# Patient Record
Sex: Male | Born: 1995 | Race: White | Hispanic: No | Marital: Married | Smoking: Never smoker
Health system: Southern US, Community
[De-identification: ages and names within clinical notes are randomized; demographics above are authoritative.]

## PROBLEM LIST (undated history)

## (undated) DIAGNOSIS — F419 Anxiety disorder, unspecified: Secondary | ICD-10-CM

## (undated) DIAGNOSIS — F329 Major depressive disorder, single episode, unspecified: Secondary | ICD-10-CM

## (undated) DIAGNOSIS — F909 Attention-deficit hyperactivity disorder, unspecified type: Secondary | ICD-10-CM

## (undated) DIAGNOSIS — F32A Depression, unspecified: Secondary | ICD-10-CM

## (undated) DIAGNOSIS — G4730 Sleep apnea, unspecified: Secondary | ICD-10-CM

## (undated) HISTORY — DX: Major depressive disorder, single episode, unspecified: F32.9

## (undated) HISTORY — DX: Anxiety disorder, unspecified: F41.9

## (undated) HISTORY — DX: Attention-deficit hyperactivity disorder, unspecified type: F90.9

## (undated) HISTORY — DX: Depression, unspecified: F32.A

## (undated) HISTORY — DX: Sleep apnea, unspecified: G47.30

## (undated) HISTORY — PX: WISDOM TOOTH EXTRACTION: SHX21

---
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
## 2016-04-02 ENCOUNTER — Ambulatory Visit (INDEPENDENT_AMBULATORY_CARE_PROVIDER_SITE_OTHER): Payer: 59 | Admitting: Physician Assistant

## 2016-04-02 VITALS — BP 109/70 | HR 100 | Temp 98.1°F | Resp 16 | Ht 71.18 in | Wt 138.6 lb

## 2016-04-02 DIAGNOSIS — B349 Viral infection, unspecified: Secondary | ICD-10-CM | POA: Diagnosis not present

## 2016-04-02 DIAGNOSIS — R0981 Nasal congestion: Secondary | ICD-10-CM | POA: Diagnosis not present

## 2016-04-02 DIAGNOSIS — R531 Weakness: Secondary | ICD-10-CM

## 2016-04-02 DIAGNOSIS — R05 Cough: Secondary | ICD-10-CM

## 2016-04-02 DIAGNOSIS — R059 Cough, unspecified: Secondary | ICD-10-CM

## 2016-04-02 MED ORDER — HYDROCODONE-HOMATROPINE 5-1.5 MG/5ML PO SYRP: 5.0000 mL | ORAL_SOLUTION | Freq: Three times a day (TID) | ORAL | 0 refills | Status: DC | PRN

## 2016-04-02 MED ORDER — BENZONATATE 100 MG PO CAPS: 100.0000 mg | ORAL_CAPSULE | Freq: Three times a day (TID) | ORAL | 0 refills | Status: DC | PRN

## 2016-04-02 MED ORDER — OSELTAMIVIR PHOSPHATE 75 MG PO CAPS: 75.0000 mg | ORAL_CAPSULE | Freq: Two times a day (BID) | ORAL | 0 refills | Status: DC

## 2016-04-02 NOTE — Patient Instructions (Addendum)
Please start Tamiflu **tonight** Flonase: 2 sprays each nostril at night and in the morning.  Neti-pot for nasal rinses.  Sore throat: Warm tea with honey, warm salt water gargles.  Please get plenty of rest and drink lots of fluids.  Come back if you are not better in 5-7 days.   Thank you for coming in today. I hope you feel we met your needs.  Feel free to call [facility] if you have any questions or further requests.  Please consider signing up for MyChart if you do not already have it, as this is a great way to communicate with me.  Best,  Whitney McVey, PA-C  IF you received an x-ray today, you will receive an invoice from [facility]. Please contact [facility] at [phone] with questions or concerns regarding your invoice.   IF you received labwork today, you will receive an invoice from [place]. Please contact [facility] at [phone] with questions or concerns regarding your invoice.   Our billing staff will not be able to assist you with questions regarding bills from these companies.  You will be contacted with the lab results as soon as they are available. The fastest way to get your results is to activate your My Chart account. Instructions are located on the last page of this paperwork. If you have not heard from [place] regarding the results in 2 weeks, please contact this office.

## 2016-04-02 NOTE — Progress Notes (Signed)
Micheal Lewis  MRN: [id number] DOB: 1995-11-25  PCP: Binnie Rail, MD  Subjective:  Pt is a 21 year old male who presents to clinic for cough, body aches, weakness x two days.  +body aches, headache, dizziness, weakness, cough, sneezing. Cough keeps him up at night. Cough present all day. Has not tried anything to feel better.  Sick contacts at work. No flu shot this year.  Denies nausea, vomiting, diarrhea, abdominal pain, chest pain, palpitations, syncope.   Review of Systems  Constitutional: Positive for chills and fatigue. Negative for diaphoresis and fever.  HENT: Positive for congestion, postnasal drip, rhinorrhea and sneezing. Negative for sinus pain and sinus pressure.   Respiratory: Positive for cough. Negative for chest tightness, shortness of breath and wheezing.   Cardiovascular: Negative for chest pain, palpitations and leg swelling.  Gastrointestinal: Negative for diarrhea, nausea and vomiting.  Musculoskeletal: Negative for neck pain.  Neurological: Negative for dizziness, syncope, light-headedness and headaches.  Psychiatric/Behavioral: Negative for sleep disturbance. The patient is not nervous/anxious.     Patient Active Problem List   Diagnosis Date Noted  . Depression 07/30/2015  . Anal itching 06/30/2015  . ADD (attention deficit disorder) 01/16/2015    Current Outpatient Prescriptions on File Prior to Visit  Medication Sig Dispense Refill  . hydrocortisone (ANUSOL-HC) 2.5 % rectal cream Place 1 application rectally 2 (two) times daily. (Patient not taking: Reported on 04/02/2016) 30 g 0  . methylphenidate (DAYTRANA) 10 mg/9hr patch Place 1 patch (10 mg total) onto the skin daily. wear patch for 9 hours only each day (Patient not taking: Reported on 04/02/2016) 30 patch 0   No current facility-administered medications on file prior to visit.     No Known Allergies   Objective:  BP 109/70 (BP Location: Right Arm, Patient Position: Sitting, Cuff Size: Small)    Pulse 100   Temp 98.1 F (36.7 C) (Oral)   Resp 16   Ht 5' 11.18" (1.808 m)   Wt 138 lb 9.6 oz (62.9 kg)   SpO2 100%   BMI 19.23 kg/m   Physical Exam  Constitutional: He is oriented to person, place, and time and well-developed, well-nourished, and in no distress. No distress.  HENT:  Right Ear: Tympanic membrane normal.  Left Ear: Tympanic membrane normal.  Nose: Mucosal edema and rhinorrhea present. Right sinus exhibits no maxillary sinus tenderness and no frontal sinus tenderness. Left sinus exhibits no maxillary sinus tenderness and no frontal sinus tenderness.  Mouth/Throat: Mucous membranes are normal. No oropharyngeal exudate, posterior oropharyngeal edema or posterior oropharyngeal erythema.  Cardiovascular: Normal rate, regular rhythm and normal heart sounds.   Pulmonary/Chest: Effort normal and breath sounds normal. No respiratory distress.  Lymphadenopathy:       Head (right side): Posterior auricular adenopathy present.       Head (left side): Posterior auricular adenopathy present.  Neurological: He is alert and oriented to person, place, and time. GCS score is 15.  Skin: Skin is warm and dry.  Psychiatric: Mood, memory, affect and judgment normal.  Vitals reviewed.   Assessment and Plan :  1. Viral illness - oseltamivir (TAMIFLU) 75 MG capsule; Take 1 capsule (75 mg total) by mouth 2 (two) times daily.  Dispense: 10 capsule; Refill: 0 - Suspect flu, will treat, as we have no in-house flu tests. Supportive care encouraged: Push fluids, rest.  2. Cough 3. Nasal congestion 4. Weakness - HYDROcodone-homatropine (HYCODAN) 5-1.5 MG/5ML syrup; Take 5 mLs by mouth every 8 (eight) hours as  needed for cough.  Dispense: 120 mL; Refill: 0 - benzonatate (TESSALON) 100 MG capsule; Take 1-2 capsules (100-200 mg total) by mouth 3 (three) times daily as needed for cough.  Dispense: 40 capsule; Refill: 0    Mercer Pod, PA-C  [facility] 04/02/2016 6:14 PM

## 2017-04-21 ENCOUNTER — Encounter: Payer: Self-pay | Admitting: Physician Assistant

## 2017-04-21 ENCOUNTER — Ambulatory Visit (INDEPENDENT_AMBULATORY_CARE_PROVIDER_SITE_OTHER): Payer: 59 | Admitting: Physician Assistant

## 2017-04-21 ENCOUNTER — Other Ambulatory Visit: Payer: Self-pay

## 2017-04-21 VITALS — BP 122/60 | HR 76 | Temp 98.0°F | Resp 16 | Ht 71.0 in | Wt 154.0 lb

## 2017-04-21 DIAGNOSIS — S0592XA Unspecified injury of left eye and orbit, initial encounter: Secondary | ICD-10-CM

## 2017-04-21 MED ORDER — ERYTHROMYCIN 5 MG/GM OP OINT: 1.0000 "application " | TOPICAL_OINTMENT | Freq: Three times a day (TID) | OPHTHALMIC | 0 refills | Status: DC

## 2017-04-21 NOTE — Patient Instructions (Addendum)
Please take the erythromycin as prescribed.  Please take ibuprofen for pain or fever. Please await contact for ophthalmology appointment.    Eye Foreign Body A foreign body is an object on or in the eye that should not be there. It could be a speck of dirt or dust, a hair, an eyelash, a splinter, or any other object. It can be on the outside of the eyeball (extraocular) or inside the eyeball. If the object is on the outside of the eyeball, it can usually be washed out or taken out by your doctor. An object inside the eyeball is an emergency, and it must be treated with surgery. Follow these instructions at home:  Take over-the-counter and prescription medicines only as told by your doctor. Use eye drops or ointment as told.  If you were prescribed antibiotic drops or ointment, use it as told by your doctor. Do not stop using it even if you start to feel better.  If you have a bandage on your eye (eye shield): ? Wear it as told. Follow instructions from your doctor about when to take it off. ? Do not drive or use heavy machinery while wearing the bandage.  If you do not have a bandage on your eye: ? Keep your eye closed as much as possible. ? Do not rub your eye. ? Wear dark glasses in bright light. ? Do not wear contact lenses until your eye feels normal, or as told by your doctor. ? If you are doing activities with a high risk of eye injury, such as using high-speed tools, wear protective eye covering.  Keep all follow-up visits as told by your doctor. This is important. Contact a doctor if:  You have more pain in your eye.  You have problems with your eye bandage.  You have abnormal fluid (discharge) coming from your eye. Get help right away if:  Your ability to see (vision) gets worse.  You have more redness and swelling in or around your eye. Summary  A foreign body is an object on or in the eye that should not be there.  An object on the outside of the eyeball can usually  be washed out or taken out by your doctor. An object inside the eyeball is an emergency.  If you have a bandage on your eye (eye shield), do not drive while wearing it.  If you have more pain in your eye, contact your doctor. This information is not intended to replace advice given to you by your health care provider. Make sure you discuss any questions you have with your health care provider. Document Released: 08/29/2009 Document Revised: 03/27/2016 Document Reviewed: 03/27/2016 Elsevier Interactive Patient Education  2017 [employer].     IF you received an x-ray today, you will receive an invoice from [facility]. Please contact [facility] at [phone] with questions or concerns regarding your invoice.   IF you received labwork today, you will receive an invoice from [place]. Please contact [facility] at [phone] with questions or concerns regarding your invoice.   Our billing staff will not be able to assist you with questions regarding bills from these companies.  You will be contacted with the lab results as soon as they are available. The fastest way to get your results is to activate your My Chart account. Instructions are located on the last page of this paperwork. If you have not heard from [place] regarding the results in 2 weeks, please contact this office.

## 2017-04-21 NOTE — Progress Notes (Addendum)
PRIMARY CARE AT [place], [place] [address] [phone]  Date:  04/21/2017   Name:  Micheal Lewis   DOB:  04/07/1995   MRN:  [id number]  PCP:  Binnie Rail, MD    History of Present Illness:  Micheal Lewis is a 22 y.o. male patient who presents to PCP with  Chief Complaint  Patient presents with  . 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  This was slightly painful but subsided.  It is not painful.  He did irrigate the eye about 1.5 hours ago.  This helped he thought.  he was wearing safety glasses and his glasses.  Mild pain with eye movement.  He has some vision changes in the left eye.    Patient Active Problem List   Diagnosis Date Noted  . Depression 07/30/2015  . Anal itching 06/30/2015  . ADD (attention deficit disorder) 01/16/2015    Past Medical History:  Diagnosis Date  . ADHD (attention deficit hyperactivity disorder)     No past surgical history on file.  Social History   Tobacco Use  . Smoking status: Never Smoker  . Smokeless tobacco: Never Used  Substance Use Topics  . Alcohol use: No  . Drug use: No    Family History  Problem Relation Age of Onset  . Hyperlipidemia Father   . Hypertension Father   . Depression Maternal Grandmother     No Known Allergies  Medication list has been reviewed and updated.  No current outpatient medications on file prior to visit.   No current facility-administered medications on file prior to visit.     ROS ROS otherwise unremarkable unless listed above.  Physical Examination: BP 122/60   Pulse 76   Temp 98 F (36.7 C) (Oral)   Resp 16   Ht 5\' 11"  (1.803 m)   Wt 154 lb (69.9 kg)   SpO2 99%   BMI 21.48 kg/m  Ideal Body Weight: Weight in (lb) to have BMI = 25: 178.9  Physical Exam  Constitutional: He is oriented to person, place, and time. He appears well-developed  and well-nourished. No distress.  HENT:  Head: Normocephalic and atraumatic.  Eyes: Conjunctivae, EOM and lids are normal. Pupils are equal, round, and reactive to light.  Slit lamp exam:      The left eye shows fluorescein uptake (the left lateral sclera with linear laceration that is 1cm just approaching the cornea).  Subconjunctival erythema at the left eye to the lateral area  Cardiovascular: Normal rate.  Pulmonary/Chest: Effort normal. No respiratory distress.  Neurological: He is alert and oriented to person, place, and time.  Skin: Skin is warm and dry. He is not diaphoretic.  Psychiatric: He has a normal mood and affect. His behavior is normal.     Visual Acuity Screening   Right eye Left eye Both eyes  Without correction:     With correction: 20/13 20/20 20/13      Assessment and Plan: Micheal Lewis is a 22 y.o. male who is here today for cc of  Chief Complaint  Patient presents with  . 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  Given alarming symptoms to warrant immediate  ED visit.  Will connect to ophthalmology tomorrow for an appointment.    Left eye injury, initial encounter - Plan: erythromycin [facility]) ophthalmic ointment  Ivar Drape, PA-C [facility] 1/29/20198:14 AM   I am scheduling with [facility] for 10am.  Called several times to no answer.   He called back and will be at the appointment.

## 2017-04-22 ENCOUNTER — Telehealth: Payer: Self-pay

## 2017-04-22 ENCOUNTER — Encounter: Payer: Self-pay | Admitting: Physician Assistant

## 2017-04-22 NOTE — Telephone Encounter (Signed)
LVM on cell and home phone. Pt has OV with [facility] today on 1/29 at 10am

## 2017-04-26 NOTE — Progress Notes (Signed)
Subjective:    Patient ID: Micheal Lewis, male    DOB: [birth year], 22 y.o.   MRN: [id number]  HPI The patient is here for an acute visit.   ? bipolar: he wonders if he is bipolar. He has high energy sometimes and low energy others..  Low energy can be bad - can last one week or a couple of months.  He wonders about seeing a psychiatrist to be evaluated for bipolar.   ADD:  He was diagnosed with ADD several years ago. He has been on several medications in the past.  He has some difficulty with focusing and would like to restart medication.  He did have decreased appetite with some medications.     Medications and allergies reviewed with patient and updated if appropriate.  Patient Active Problem List   Diagnosis Date Noted  . Depression 07/30/2015  . Anal itching 06/30/2015  . ADD (attention deficit disorder) 01/16/2015    No current outpatient medications on file prior to visit.   No current facility-administered medications on file prior to visit.     Past Medical History:  Diagnosis Date  . ADHD (attention deficit hyperactivity disorder)     No past surgical history on file.  Social History   Socioeconomic History  . Marital status: Single    Spouse name: None  . Number of children: None  . Years of education: None  . Highest education level: None  Social Needs  . Financial resource strain: None  . Food insecurity - worry: None  . Food insecurity - inability: None  . Transportation needs - medical: None  . Transportation needs - non-medical: None  Occupational History  . None  Tobacco Use  . Smoking status: Never Smoker  . Smokeless tobacco: Never Used  Substance and Sexual Activity  . Alcohol use: No  . Drug use: No  . Sexual activity: None  Other Topics Concern  . None  Social History Narrative   Exercises - run, lifts      College - [employer]    Family History  Problem Relation Age of Onset  . Hyperlipidemia Father   . Hypertension Father     . Depression Maternal Grandmother     Review of Systems  Constitutional: Negative for appetite change and fatigue.  Respiratory: Negative for shortness of breath.   Cardiovascular: Negative for chest pain and palpitations.  Neurological: Negative for light-headedness and headaches.  Psychiatric/Behavioral: Positive for dysphoric mood (mild, functioning). Negative for self-injury, sleep disturbance and suicidal ideas.       Objective:   Vitals:   04/28/17 1551  BP: 124/70  Pulse: 81  Resp: 16  Temp: 98.7 F (37.1 C)  SpO2: 98%   Wt Readings from Last 3 Encounters:  04/28/17 157 lb (71.2 kg)  04/21/17 154 lb (69.9 kg)  04/02/16 138 lb 9.6 oz (62.9 kg) (25 %, Z= -0.69)*   * Growth percentiles are based on CDC (Boys, 2-20 Years) data.   Body mass index is 21.9 kg/m.   Physical Exam  Constitutional: He appears well-developed and well-nourished. No distress.  HENT:  Head: Normocephalic and atraumatic.  Cardiovascular: Normal rate, regular rhythm and normal heart sounds.  No murmur heard. Pulmonary/Chest: Effort normal and breath sounds normal. No respiratory distress. He has no wheezes. He has no rales.  Musculoskeletal: He exhibits no edema.  Skin: He is not diaphoretic.  Psychiatric: He has a normal mood and affect. His behavior is normal. Judgment and thought content normal.  Assessment & Plan:    See Problem List for Assessment and Plan of chronic medical problems.

## 2017-04-28 ENCOUNTER — Encounter: Payer: Self-pay | Admitting: Internal Medicine

## 2017-04-28 ENCOUNTER — Ambulatory Visit: Payer: PRIVATE HEALTH INSURANCE | Admitting: Internal Medicine

## 2017-04-28 ENCOUNTER — Other Ambulatory Visit: Payer: PRIVATE HEALTH INSURANCE

## 2017-04-28 VITALS — BP 124/70 | HR 81 | Temp 98.7°F | Resp 16 | Wt 157.0 lb

## 2017-04-28 DIAGNOSIS — F988 Other specified behavioral and emotional disorders with onset usually occurring in childhood and adolescence: Secondary | ICD-10-CM

## 2017-04-28 DIAGNOSIS — R4586 Emotional lability: Secondary | ICD-10-CM

## 2017-04-28 DIAGNOSIS — Z113 Encounter for screening for infections with a predominantly sexual mode of transmission: Secondary | ICD-10-CM

## 2017-04-28 MED ORDER — AMPHETAMINE-DEXTROAMPHETAMINE 10 MG PO TABS: 10.0000 mg | ORAL_TABLET | Freq: Every day | ORAL | 0 refills | Status: DC

## 2017-04-28 NOTE — Assessment & Plan Note (Signed)
He is concerned about bipolar Will refer to psych for further eval

## 2017-04-28 NOTE — Patient Instructions (Addendum)
Start Adderall 10 mg daily.  Call if you have any side effects.   Follow up with me in 4 weeks.   Have blood work today.    A referral was ordered for psychiatry

## 2017-04-28 NOTE — Assessment & Plan Note (Signed)
When asked if he wanted an hiv screening test - he said yes and would like full panel done asympotmatic

## 2017-04-28 NOTE — Assessment & Plan Note (Signed)
Having increased ADD symptoms Will try adderall 10 mg daily - does not want long acting medication F/u in 4 weeks

## 2017-04-29 LAB — HEPATITIS C ANTIBODY
HEP C AB: NONREACTIVE
SIGNAL TO CUT-OFF: 0.01 (ref <1.00)

## 2017-04-29 LAB — HSV 2 ANTIBODY, IGG

## 2017-04-29 LAB — RPR: RPR: NONREACTIVE

## 2017-04-29 LAB — HIV ANTIBODY (ROUTINE TESTING W REFLEX): HIV: NONREACTIVE

## 2017-04-29 LAB — HSV 1 ANTIBODY, IGG: HSV 1 Glycoprotein G Ab, IgG: <0.9 index

## 2017-05-10 ENCOUNTER — Encounter: Payer: Self-pay | Admitting: Physician Assistant

## 2017-05-10 DIAGNOSIS — S058X9A Other injuries of unspecified eye and orbit, initial encounter: Secondary | ICD-10-CM | POA: Insufficient documentation

## 2017-06-23 ENCOUNTER — Encounter: Payer: Self-pay | Admitting: Physician Assistant

## 2017-07-02 ENCOUNTER — Ambulatory Visit (HOSPITAL_COMMUNITY): Payer: PRIVATE HEALTH INSURANCE | Admitting: Psychiatry

## 2017-07-18 ENCOUNTER — Other Ambulatory Visit: Payer: Self-pay | Admitting: Internal Medicine

## 2017-07-18 NOTE — Telephone Encounter (Signed)
Copied from [place]. Topic: Quick Communication - Rx Refill/Question >> Jul 18, 2017  5:11 PM Neva Seat wrote: amphetamine-dextroamphetamine (ADDERALL) 10 MG tablet   [id number] [place], [place] - [address] [place] [address] Phone: [phone] Fax: [phone]   >> Jul 18, 2017  5:17 PM Neva Seat wrote: Pt is out of Rx - refill asap

## 2017-07-21 MED ORDER — AMPHETAMINE-DEXTROAMPHETAMINE 10 MG PO TABS: 10.0000 mg | ORAL_TABLET | Freq: Every day | ORAL | 0 refills | Status: DC

## 2017-07-21 NOTE — Telephone Encounter (Signed)
Request refill on Adderall; last refill 04/28/17; #30; no refills Last office visit: 04/28/17 PCP Dr. Darrel Hoover.:  [facility] [address].

## 2017-07-31 NOTE — Patient Instructions (Signed)
  Test(s) ordered today. Your results will be released to [facility] (or called to you) after review, usually within 72hours after test completion. If any changes need to be made, you will be notified at that same time.   Medications reviewed and updated.  Changes include  /  No changes recommended at this time.  Your prescription(s) have been submitted to your pharmacy. Please take as directed and contact our office if you believe you are having problem(s) with the medication(s).   Please followup in 6 months

## 2017-07-31 NOTE — Progress Notes (Signed)
Subjective:    Patient ID: Micheal Lewis, male    DOB: 03-04-1996, 22 y.o.   MRN: [id number]  HPI The patient is here for follow up.  ADD:  He is taking his medication as prescribed.  He feels the medication is effective.  He denies side effects, including palpitations, headaches, lightheadedness, decreased appetite and weight loss.    Medications and allergies reviewed with patient and updated if appropriate.  Patient Active Problem List   Diagnosis Date Noted  . Superficial injury of cornea 05/10/2017  . Screening for STD (sexually transmitted disease) 04/28/2017  . Mood changes 04/28/2017  . Depression 07/30/2015  . ADD (attention deficit disorder) 01/16/2015    Current Outpatient Medications on File Prior to Visit  Medication Sig Dispense Refill  . amphetamine-dextroamphetamine (ADDERALL) 10 MG tablet Take 1 tablet (10 mg total) by mouth daily with breakfast. 30 tablet 0   No current facility-administered medications on file prior to visit.     Past Medical History:  Diagnosis Date  . ADHD (attention deficit hyperactivity disorder)     No past surgical history on file.  Social History   Socioeconomic History  . Marital status: Single    Spouse name: Not on file  . Number of children: Not on file  . Years of education: Not on file  . Highest education level: Not on file  Occupational History  . Not on file  Social Needs  . Financial resource strain: Not on file  . Food insecurity:    Worry: Not on file    Inability: Not on file  . Transportation needs:    Medical: Not on file    Non-medical: Not on file  Tobacco Use  . Smoking status: Never Smoker  . Smokeless tobacco: Never Used  Substance and Sexual Activity  . Alcohol use: No  . Drug use: No  . Sexual activity: Not on file  Lifestyle  . Physical activity:    Days per week: Not on file    Minutes per session: Not on file  . Stress: Not on file  Relationships  . Social connections:    Talks  on phone: Not on file    Gets together: Not on file    Attends religious service: Not on file    Active member of club or organization: Not on file    Attends meetings of clubs or organizations: Not on file    Relationship status: Not on file  Other Topics Concern  . Not on file  Social History Narrative   Exercises - run, lifts      College - [employer]    Family History  Problem Relation Age of Onset  . Hyperlipidemia Father   . Hypertension Father   . Depression Maternal Grandmother     Review of Systems     Objective:  There were no vitals filed for this visit. BP Readings from Last 3 Encounters:  04/28/17 124/70  04/21/17 122/60  04/02/16 109/70   Wt Readings from Last 3 Encounters:  04/28/17 157 lb (71.2 kg)  04/21/17 154 lb (69.9 kg)  04/02/16 138 lb 9.6 oz (62.9 kg) (25 %, Z= -0.69)*   * Growth percentiles are based on CDC (Boys, 2-20 Years) data.   There is no height or weight on file to calculate BMI.   Physical Exam    Constitutional: Appears well-developed and well-nourished. No distress.  HENT:  Head: Normocephalic and atraumatic.  Neck: Neck supple. No tracheal deviation present. No  thyromegaly present.  No cervical lymphadenopathy Cardiovascular: Normal rate, regular rhythm and normal heart sounds.   No murmur heard. No carotid bruit .  No edema Pulmonary/Chest: Effort normal and breath sounds normal. No respiratory distress. No has no wheezes. No rales.  Skin: Skin is warm and dry. Not diaphoretic.  Psychiatric: Normal mood and affect. Behavior is normal.      Assessment & Plan:    See Problem List for Assessment and Plan of chronic medical problems.   This encounter was created in error - please disregard.

## 2017-08-01 ENCOUNTER — Encounter: Payer: Self-pay | Admitting: Internal Medicine

## 2017-08-01 DIAGNOSIS — Z0289 Encounter for other administrative examinations: Secondary | ICD-10-CM

## 2017-08-28 NOTE — Progress Notes (Signed)
    Subjective:    Patient ID: Micheal Lewis, male    DOB: 1995-09-23, 22 y.o.   MRN: [id number]  HPI     Medications and allergies reviewed with patient and updated if appropriate.  Patient Active Problem List   Diagnosis Date Noted  . Superficial injury of cornea 05/10/2017  . Screening for STD (sexually transmitted disease) 04/28/2017  . Mood changes 04/28/2017  . Depression 07/30/2015  . ADD (attention deficit disorder) 01/16/2015    Current Outpatient Medications on File Prior to Visit  Medication Sig Dispense Refill  . amphetamine-dextroamphetamine (ADDERALL) 10 MG tablet Take 1 tablet (10 mg total) by mouth daily with breakfast. 30 tablet 0   No current facility-administered medications on file prior to visit.     Past Medical History:  Diagnosis Date  . ADHD (attention deficit hyperactivity disorder)     No past surgical history on file.  Social History   Socioeconomic History  . Marital status: Single    Spouse name: Not on file  . Number of children: Not on file  . Years of education: Not on file  . Highest education level: Not on file  Occupational History  . Not on file  Social Needs  . Financial resource strain: Not on file  . Food insecurity:    Worry: Not on file    Inability: Not on file  . Transportation needs:    Medical: Not on file    Non-medical: Not on file  Tobacco Use  . Smoking status: Never Smoker  . Smokeless tobacco: Never Used  Substance and Sexual Activity  . Alcohol use: No  . Drug use: No  . Sexual activity: Not on file  Lifestyle  . Physical activity:    Days per week: Not on file    Minutes per session: Not on file  . Stress: Not on file  Relationships  . Social connections:    Talks on phone: Not on file    Gets together: Not on file    Attends religious service: Not on file    Active member of club or organization: Not on file    Attends meetings of clubs or organizations: Not on file    Relationship status:  Not on file  Other Topics Concern  . Not on file  Social History Narrative   Exercises - run, lifts      College - [employer]    Family History  Problem Relation Age of Onset  . Hyperlipidemia Father   . Hypertension Father   . Depression Maternal Grandmother     Review of Systems     Objective:  There were no vitals filed for this visit. BP Readings from Last 3 Encounters:  04/28/17 124/70  04/21/17 122/60  04/02/16 109/70   Wt Readings from Last 3 Encounters:  04/28/17 157 lb (71.2 kg)  04/21/17 154 lb (69.9 kg)  04/02/16 138 lb 9.6 oz (62.9 kg) (25 %, Z= -0.69)*   * Growth percentiles are based on CDC (Boys, 2-20 Years) data.   There is no height or weight on file to calculate BMI.   Physical Exam          Assessment & Plan:    See Problem List for Assessment and Plan of chronic medical problems.   This encounter was created in error - please disregard.

## 2017-08-28 NOTE — Patient Instructions (Addendum)
  Medications reviewed and updated.  No changes recommended at this time.  Your prescription(s) have been submitted to your pharmacy. Please take as directed and contact our office if you believe you are having problem(s) with the medication(s).    Please followup in 6 months   

## 2017-08-29 ENCOUNTER — Encounter: Payer: Self-pay | Admitting: Internal Medicine

## 2017-08-29 DIAGNOSIS — Z0289 Encounter for other administrative examinations: Secondary | ICD-10-CM

## 2017-09-02 ENCOUNTER — Ambulatory Visit (INDEPENDENT_AMBULATORY_CARE_PROVIDER_SITE_OTHER): Payer: PRIVATE HEALTH INSURANCE | Admitting: Psychiatry

## 2017-09-02 ENCOUNTER — Encounter (HOSPITAL_COMMUNITY): Payer: Self-pay | Admitting: Psychiatry

## 2017-09-02 VITALS — BP 112/62 | HR 65 | Ht 73.0 in | Wt 151.4 lb

## 2017-09-02 DIAGNOSIS — R454 Irritability and anger: Secondary | ICD-10-CM | POA: Diagnosis not present

## 2017-09-02 DIAGNOSIS — Z91411 Personal history of adult psychological abuse: Secondary | ICD-10-CM | POA: Diagnosis not present

## 2017-09-02 DIAGNOSIS — F419 Anxiety disorder, unspecified: Secondary | ICD-10-CM | POA: Diagnosis not present

## 2017-09-02 DIAGNOSIS — F1390 Sedative, hypnotic, or anxiolytic use, unspecified, uncomplicated: Secondary | ICD-10-CM

## 2017-09-02 DIAGNOSIS — R4587 Impulsiveness: Secondary | ICD-10-CM

## 2017-09-02 DIAGNOSIS — F39 Unspecified mood [affective] disorder: Secondary | ICD-10-CM

## 2017-09-02 DIAGNOSIS — Z9114 Patient's other noncompliance with medication regimen: Secondary | ICD-10-CM

## 2017-09-02 DIAGNOSIS — Z765 Malingerer [conscious simulation]: Secondary | ICD-10-CM | POA: Diagnosis not present

## 2017-09-02 DIAGNOSIS — Z7289 Other problems related to lifestyle: Secondary | ICD-10-CM

## 2017-09-02 DIAGNOSIS — Z818 Family history of other mental and behavioral disorders: Secondary | ICD-10-CM

## 2017-09-02 DIAGNOSIS — R45851 Suicidal ideations: Secondary | ICD-10-CM | POA: Diagnosis not present

## 2017-09-02 MED ORDER — ARIPIPRAZOLE 2 MG PO TABS: 2.0000 mg | ORAL_TABLET | Freq: Every day | ORAL | 2 refills | Status: DC
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
## 2017-10-06 ENCOUNTER — Ambulatory Visit (HOSPITAL_COMMUNITY): Payer: PRIVATE HEALTH INSURANCE | Admitting: Psychiatry

## 2017-10-07 ENCOUNTER — Encounter (HOSPITAL_COMMUNITY): Payer: Self-pay | Admitting: Psychiatry

## 2017-10-07 ENCOUNTER — Ambulatory Visit (INDEPENDENT_AMBULATORY_CARE_PROVIDER_SITE_OTHER): Payer: PRIVATE HEALTH INSURANCE | Admitting: Psychiatry

## 2017-10-07 VITALS — BP 120/68 | HR 90 | Ht 73.0 in | Wt 153.0 lb

## 2017-10-07 DIAGNOSIS — Z811 Family history of alcohol abuse and dependence: Secondary | ICD-10-CM

## 2017-10-07 DIAGNOSIS — F39 Unspecified mood [affective] disorder: Secondary | ICD-10-CM

## 2017-10-07 DIAGNOSIS — Z813 Family history of other psychoactive substance abuse and dependence: Secondary | ICD-10-CM

## 2017-10-07 NOTE — Progress Notes (Signed)
BH MD/PA/NP OP Progress Note  10/07/2017 10:48 AM Micheal Lewis  MRN:  [id number]  Chief Complaint: medicine didn't work HPI: Micheal Lewis reports that he took the Abilify for 2 weeks, it mostly just made him feel tired, so he stopped.  He reports that he is not feeling acutely unsafe with himself.  He has periodic thoughts about suicide but reports that commonsense prevents him from doing that, and he feels close with his family.  He has thought about individual therapy and agrees to a referral to start individual therapy in office.  We agreed to follow-up on an as-needed basis and consider medication if needed in the future.  Visit Diagnosis:    ICD-10-CM   1. Unspecified mood (affective) disorder ([place]) F39     Past Psychiatric History: See intake H&P for full details. Reviewed, with no updates at this time.   Past Medical History:  Past Medical History:  Diagnosis Date  . ADHD (attention deficit hyperactivity disorder)   . Anxiety   . Depression    History reviewed. No pertinent surgical history.  Family Psychiatric History: See intake H&P for full details. Reviewed, with no updates at this time.   Family History:  Family History  Problem Relation Age of Onset  . Hyperlipidemia Father   . Hypertension Father   . ADD / ADHD Father   . Post-traumatic stress disorder Mother   . Depression Sister   . Anxiety disorder Sister   . Depression Maternal Grandmother   . Alcohol abuse Paternal Uncle   . Depression Maternal Grandfather     Social History:  Social History   Socioeconomic History  . Marital status: Single    Spouse name: Not on file  . Number of children: 0  . Years of education: Not on file  . Highest education level: High school graduate  Occupational History  . Not on file  Social Needs  . Financial resource strain: Not hard at all  . Food insecurity:    Worry: Never true    Inability: Never true  . Transportation needs:    Medical: No   Non-medical: No  Tobacco Use  . Smoking status: Never Smoker  . Smokeless tobacco: Never Used  Substance and Sexual Activity  . Alcohol use: No  . Drug use: Yes    Types: Benzodiazepines    Comment: occasional  . Sexual activity: Yes    Birth control/protection: None  Lifestyle  . Physical activity:    Days per week: 7 days    Minutes per session: 30 min  . Stress: Very much  Relationships  . Social connections:    Talks on phone: More than three times a week    Gets together: More than three times a week    Attends religious service: Never    Active member of club or organization: Yes    Attends meetings of clubs or organizations: More than 4 times per year    Relationship status: Living with partner  Other Topics Concern  . Not on file  Social History Narrative   Exercises - run, lifts      College - [employer]    Allergies: No Known Allergies  Metabolic Disorder Labs: No results found for: HGBA1C, MPG No results found for: PROLACTIN Lab Results  Component Value Date   CHOL 124 01/10/2015   TRIG 71.0 01/10/2015   HDL 37.10 (L) 01/10/2015   CHOLHDL 3 01/10/2015   VLDL 14.2 01/10/2015   LDLCALC 73 01/10/2015  Lab Results  Component Value Date   TSH 1.89 01/10/2015    Therapeutic Level Labs: No results found for: LITHIUM No results found for: VALPROATE No components found for:  CBMZ  Current Medications: No current outpatient medications on file.   No current facility-administered medications for this visit.      Musculoskeletal: Strength & Muscle Tone: within normal limits Gait & Station: normal Patient leans: N/A  Psychiatric Specialty Exam: ROS  Blood pressure 120/68, pulse 90, height 6\' 1"  (1.854 m), weight 153 lb (69.4 kg).Body mass index is 20.19 kg/m.  General Appearance: Casual and Well Groomed  Eye Contact:  Fair  Speech:  Clear and Coherent and Normal Rate  Volume:  Normal  Mood:  Dysphoric and mood up and down  Affect:  Constricted and  Flat  Thought Process:  Goal Directed and Descriptions of Associations: Intact  Orientation:  Full (Time, Place, and Person)  Thought Content: Logical   Suicidal Thoughts:  No  Homicidal Thoughts:  No  Memory:  Immediate;   Good  Judgement:  Fair  Insight:  Fair  Psychomotor Activity:  Normal  Concentration:  Concentration: Fair  Recall:  [place] of Knowledge: Good  Language: Good  Akathisia:  Negative  Handed:  Right  AIMS (if indicated): not done  Assets:  Communication Skills Desire for Improvement Financial Resources/Insurance Housing  ADL's:  Intact  Cognition: WNL  Sleep:  Fair   Screenings: PHQ2-9     Office Visit from 04/28/2017 in [place] Office Visit from 04/02/2016 in Primary Care at [facility] Total Score  1  3  PHQ-9 Total Score  8  14       Assessment and Plan: Micheal Lewis presents for med management follow-up.  He did not like the way Abilify made him feel, it gave him a headache and made him feel tired so he stopped after 2 weeks.  He presents with no acute safety issues nor does he present with any gross mood lability or any agitation.  He describes an up and down mood generally related to external locus of control, and complexity in his relationships.  I believe he would most benefit from individual therapy and skills building, and he is agreeable to proceed as recommended.  No medication interventions at this time, he is welcome to follow-up with this writer on an as-needed basis.  1. Unspecified mood (affective) disorder (HCC)     Status of current problems: unchanged  Labs Ordered: No orders of the defined types were placed in this encounter.   Labs Reviewed: n/a  Collateral Obtained/Records Reviewed: n/a  Plan:  No indication for stimulant or medications at this time, patient is in agreement and prefers to initiate individual therapy Reviewed the risks and benefits of Lamictal, as that would be the next most  appropriate intervention for mood lability and unspecified mood disorder symptoms -reviewed the common side effects and risk of Stevens-Johnson syndrome Follow up prn  Aundra Dubin, MD 10/07/2017, 10:48 AM

## 2017-11-11 ENCOUNTER — Ambulatory Visit (INDEPENDENT_AMBULATORY_CARE_PROVIDER_SITE_OTHER): Payer: PRIVATE HEALTH INSURANCE | Admitting: Licensed Clinical Social Worker

## 2017-11-11 ENCOUNTER — Encounter

## 2017-11-11 ENCOUNTER — Encounter (HOSPITAL_COMMUNITY): Payer: Self-pay | Admitting: Licensed Clinical Social Worker

## 2017-11-11 DIAGNOSIS — F39 Unspecified mood [affective] disorder: Secondary | ICD-10-CM

## 2017-11-11 NOTE — Progress Notes (Signed)
Comprehensive Clinical Assessment (CCA) Note  11/11/2017 Micheal Lewis [id number]  Visit Diagnosis:      ICD-10-CM   1. Unspecified mood (affective) disorder (HCC) F39       CCA Part One  Part One has been completed on paper by the patient.  (See scanned document in Chart Review)  CCA Part Two A  Intake/Chief Complaint:  CCA Intake With Chief Complaint CCA Part Two Date: 11/11/17 CCA Part Two Time: 1421 Chief Complaint/Presenting Problem: "I want to work on getting more assertive and becoming more outgoing". "I get depressed and have trouble getting out of bed" "I've had a panic attack". Patients Currently Reported Symptoms/Problems: Mood lability, depression, can't get out of bed, can't bring self to eat, trouble decision making, commuincation troubles, very mild stuttering,  excessive worry, Individual's Strengths: analytical thinker, good w/ money, insight into sxs, strong sense of values Individual's Preferences: "I appreciate unfiltered advice from counselors" Individual's Abilities: Able bodied Type of Services Patient Feels Are Needed: Individual counseling  Mental Health Symptoms Depression:  Depression: Change in energy/activity, Difficulty Concentrating, Fatigue, Sleep (too much or little), Irritability, Hopelessness, Increase/decrease in appetite  Mania:     Anxiety:   Anxiety: Difficulty concentrating, Restlessness, Irritability, Tension, Worrying  Psychosis:     Trauma:     Obsessions:     Compulsions:     Inattention:     Hyperactivity/Impulsivity:     Oppositional/Defiant Behaviors:     Borderline Personality:     Other Mood/Personality Symptoms:      Mental Status Exam Appearance and self-care  Stature:  Stature: Tall  Weight:  Weight: Underweight  Clothing:  Clothing: Neat/clean  Grooming:  Grooming: Well-groomed  Cosmetic use:  Cosmetic Use: None  Posture/gait:  Posture/Gait: Normal  Motor activity:  Motor Activity: Not Remarkable  Sensorium   Attention:  Attention: Normal  Concentration:  Concentration: Scattered  Orientation:  Orientation: X5  Recall/memory:  Recall/Memory: Normal  Affect and Mood  Affect:  Affect: Appropriate  Mood:  Mood: Euthymic  Relating  Eye contact:  Eye Contact: Normal  Facial expression:  Facial Expression: Responsive  Attitude toward examiner:  Attitude Toward Examiner: Cooperative  Thought and Language  Speech flow: Speech Flow: Normal  Thought content:  Thought Content: Appropriate to mood and circumstances  Preoccupation:     Hallucinations:     Organization:     Transport planner of Knowledge:  Fund of Knowledge: Average  Intelligence:  Intelligence: Above [employer]  Abstraction:  Abstraction: Normal  Judgement:  Judgement: Common-sensical  Reality Testing:  Reality Testing: Adequate  Insight:  Insight: Fair  Decision Making:  Decision Making: Normal  Social Functioning  Social Maturity:  Social Maturity: Responsible  Social Judgement:  Social Judgement: Normal  Stress  Stressors:  Stressors: Chiropodist, Work  Coping Ability:  Coping Ability: English as a second language teacher Deficits:     Supports:      Family and Psychosocial History: Family history Marital status: Single Are you sexually active?: Yes What is your sexual orientation?: heterosexual Does patient have children?: No  Childhood History:  Childhood History By whom was/is the patient raised?: Both parents Description of patient's relationship with caregiver when they were a child: "I love my mother and father. Father gets on my nerves sometimes. We argue but we keep it civil" Patient's description of current relationship with people who raised him/her: "good, I live w/ them" Does patient have siblings?: Yes Number of Siblings: 1 Description of patient's current relationship with siblings: "She tried  to commit suicide earlier this year and we're much more distant than we have been in the past". Did patient suffer any  verbal/emotional/physical/sexual abuse as a child?: No Did patient suffer from severe childhood neglect?: No Has patient ever been sexually abused/assaulted/raped as an adolescent or adult?: No Was the patient ever a victim of a crime or a disaster?: No Witnessed domestic violence?: No Has patient been effected by domestic violence as an adult?: No  CCA Part Two B  Employment/Work Situation: Employment / Work Copywriter, advertising Employment situation: Employed Where is patient currently employed?: [employer] How long has patient been employed?: A few weeks  Education: Museum/gallery curator Currently Attending: [employer] Name of High School: [employer] Did Teacher, adult education From [employer]?: Yes Did Physicist, medical?: Yes What Type of College Degree Do you Have?: Did not finish at [employer] 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 Did You Have Any Special Interests In School?: English, writing Did You Have Any Difficulty At [employer]?: No  Religion:    Leisure/Recreation: Leisure / Recreation Leisure and Hobbies: Control and instrumentation engineer Games, reading  Exercise/Diet: Exercise/Diet Do You Exercise?: Yes What Type of Exercise Do You Do?: Weight Training, Other (Comment)("On my feet all day at work") How Many Times a Week Do You Exercise?: 1-3 times a week Have You Gained or Lost A Significant Amount of Weight in the Past Six Months?: No Do You Follow a Special Diet?: No Do You Have Any Trouble Sleeping?: Yes Explanation of Sleeping Difficulties: "It's getting better"  CCA Part Two C  Alcohol/Drug Use: Alcohol / Drug Use Prescriptions: Hx of stimulant use as prescribed but recently stopped in June 2019,  History of alcohol / drug use?: No history of alcohol / drug abuse                      CCA Part Three  ASAM's:  Six Dimensions of Multidimensional Assessment  Dimension 1:  Acute Intoxication and/or Withdrawal Potential:     Dimension 2:  Biomedical Conditions and Complications:     Dimension 3:  Emotional,  Behavioral, or Cognitive Conditions and Complications:     Dimension 4:  Readiness to Change:     Dimension 5:  Relapse, Continued use, or Continued Problem Potential:     Dimension 6:  Recovery/Living Environment:      Substance use Disorder (SUD)    Social Function:  Social Functioning Social Maturity: Responsible Social Judgement: Normal  Stress:  Stress Stressors: Chiropodist, Work Coping Ability: Overwhelmed Priority Risk: Low Acuity  Risk Assessment- Self-Harm Potential: Risk Assessment For Self-Harm Potential Thoughts of Self-Harm: No current thoughts Method: No plan  Risk Assessment -Dangerous to Others Potential: Risk Assessment For Dangerous to Others Potential Method: No Plan Availability of Means: No access or NA  DSM5 Diagnoses: Patient Active Problem List   Diagnosis Date Noted  . Superficial injury of cornea 05/10/2017  . Screening for STD (sexually transmitted disease) 04/28/2017  . Mood changes 04/28/2017  . Depression 07/30/2015  . ADD (attention deficit disorder) 01/16/2015    Patient Centered Plan: Patient is on the following Treatment Plan(s):  Depression  Recommendations for Services/Supports/Treatments: Recommendations for Services/Supports/Treatments Recommendations For Services/Supports/Treatments: Individual Therapy  Treatment Plan Summary:    Referrals to Alternative Service(s): Referred to Alternative Service(s):   Place:   Date:   Time:    Referred to Alternative Service(s):   Place:   Date:   Time:    Referred to Alternative Service(s):   Place:   Date:  Time:    Referred to Alternative Service(s):   Place:   Date:   Time:     Archie Balboa

## 2017-12-22 ENCOUNTER — Ambulatory Visit (HOSPITAL_COMMUNITY): Payer: Self-pay | Admitting: Licensed Clinical Social Worker

## 2018-01-05 ENCOUNTER — Encounter

## 2018-01-05 ENCOUNTER — Ambulatory Visit (HOSPITAL_COMMUNITY): Payer: Self-pay | Admitting: Licensed Clinical Social Worker

## 2018-01-19 ENCOUNTER — Encounter

## 2018-01-19 ENCOUNTER — Ambulatory Visit (INDEPENDENT_AMBULATORY_CARE_PROVIDER_SITE_OTHER): Payer: PRIVATE HEALTH INSURANCE | Admitting: Licensed Clinical Social Worker

## 2018-01-19 DIAGNOSIS — F331 Major depressive disorder, recurrent, moderate: Secondary | ICD-10-CM | POA: Diagnosis not present

## 2018-01-22 ENCOUNTER — Encounter (HOSPITAL_COMMUNITY): Payer: Self-pay | Admitting: Licensed Clinical Social Worker

## 2018-01-22 NOTE — Progress Notes (Signed)
   THERAPIST PROGRESS NOTE  Session Time: 11-12  Participation Level: Active  Behavioral Response: CasualAlertDysphoric  Type of Therapy: Individual Therapy  Treatment Goals addressed: Coping  Interventions: CBT and Motivational Interviewing  Summary: Micheal Lewis is a 22 y.o. male who presents with hx of MDD and ADHD w/ stimulant tx.  "I have a lot of guilt for lying to my girlfriend about the nature of another relationship I was having when we first started dating."  Pt is active, engaged, fidgety, and shifts body position throughout session. When discussing shame he abruptly sinks low into the couch, nearly laying down horizontally. He shares that his mood has been mostly stable. He lost his job at [employer] for unspecified reasons. He appears uncomfortable when he begins to discuss his relationship w/ his girlfriend. He admits that he lied to his girlfriend in the beginning of their relationship since he was also talking to another male at the time. Pt reports this recently came out in an argument and pt is worried about his resentment he feels towards his current gf and also the resentment she feels towards him. Counselor and pt discuss pt's feelings and emotional state which pt avoids through intellectualizing of his feelings. Counselor and pt create and discuss pt's tx goals and pt signs electronic tx plan.  Suicidal/Homicidal: Nowithout intent/plan  Therapist Response: Counselor used open questions, active listening, and SMART goal planning. Pt admits he struggles w/ being honest and that he lies when it is easier to tell the truth. Pt has a long hx of "wanting things to appear better than they are" for his life. He struggles w/ people pleasing, lack of motivation, and communication problems.  Plan: Return again in 2 weeks.  Diagnosis:    ICD-10-CM   1. 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

## 2018-03-02 ENCOUNTER — Ambulatory Visit (INDEPENDENT_AMBULATORY_CARE_PROVIDER_SITE_OTHER): Payer: PRIVATE HEALTH INSURANCE | Admitting: Licensed Clinical Social Worker

## 2018-03-02 DIAGNOSIS — F331 Major depressive disorder, recurrent, moderate: Secondary | ICD-10-CM | POA: Diagnosis not present

## 2018-03-06 ENCOUNTER — Encounter (HOSPITAL_COMMUNITY): Payer: Self-pay | Admitting: Licensed Clinical Social Worker

## 2018-03-06 NOTE — Progress Notes (Signed)
   THERAPIST PROGRESS NOTE  Session Time: 9-10  Participation Level: Active  Behavioral Response: Well GroomedAlertDepressed  Type of Therapy: Individual Therapy  Treatment Goals addressed: Diagnosis: MDD  Interventions: CBT and Supportive  Summary: Micheal Lewis is a 22 y.o. male who presents with MDD.  Pt is active, engaged, flat, w/ soft speech. He is oriented and attention span is mildly distracted. 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 he does endorse more depression, w/ sadness occurring most days out of the week. Pt reports he tries hard to help his girlfriend and this borders on "fixing" her. Counselor spent time discussing topic of codependency.    Suicidal/Homicidal: Nowithout intent/plan  Therapist Response: Counselor used open questions, active listening, cognitive challenging, and psychoeducation. Counselor  emphasized pt's codependent traits and how this contributes to anxiety and depression. Pt was receptive to this and appears genuinely interested in learning more about codependency.   Plan: Return again in 4 weeks.  Diagnosis:    ICD-10-CM   1. 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

## 2018-03-16 ENCOUNTER — Ambulatory Visit (HOSPITAL_COMMUNITY): Payer: PRIVATE HEALTH INSURANCE | Admitting: Licensed Clinical Social Worker

## 2018-03-30 ENCOUNTER — Ambulatory Visit (INDEPENDENT_AMBULATORY_CARE_PROVIDER_SITE_OTHER): Payer: PRIVATE HEALTH INSURANCE | Admitting: Licensed Clinical Social Worker

## 2018-03-30 DIAGNOSIS — F331 Major depressive disorder, recurrent, moderate: Secondary | ICD-10-CM

## 2018-03-30 NOTE — Progress Notes (Signed)
Pt did not show. Called and left VM for pt.

## 2018-04-06 ENCOUNTER — Ambulatory Visit (HOSPITAL_COMMUNITY): Payer: PRIVATE HEALTH INSURANCE | Admitting: Licensed Clinical Social Worker

## 2018-04-20 ENCOUNTER — Ambulatory Visit (HOSPITAL_COMMUNITY): Payer: PRIVATE HEALTH INSURANCE | Admitting: Licensed Clinical Social Worker

## 2018-08-06 NOTE — Patient Instructions (Addendum)
Tests ordered today. Your results will be released to [place] (or called to you) after review, usually within 72hours after test completion. If any changes need to be made, you will be notified at that same time.  All other Health Maintenance issues reviewed.   All recommended immunizations and age-appropriate screenings are up-to-date or discussed.  No immunizations administered today.   Medications reviewed and updated.  Changes include :   Adderall 10 mg daily  Your prescription(s) have been submitted to your pharmacy. Please take as directed and contact our office if you believe you are having problem(s) with the medication(s).   Please followup in one month    Health Maintenance, Male A healthy lifestyle and preventive care is important for your health and wellness. Ask your health care provider about what schedule of regular examinations is right for you. What should I know about weight and diet? Eat a Healthy Diet  Eat plenty of vegetables, fruits, whole grains, low-fat dairy products, and lean protein.  Do not eat a lot of foods high in solid fats, added sugars, or salt.  Maintain a Healthy Weight Regular exercise can help you achieve or maintain a healthy weight. You should:  Do at least 150 minutes of exercise each week. The exercise should increase your heart rate and make you sweat (moderate-intensity exercise).  Do strength-training exercises at least twice a week. Watch Your Levels of Cholesterol and Blood Lipids  Have your blood tested for lipids and cholesterol every 5 years starting at 22 years of age. If you are at high risk for heart disease, you should start having your blood tested when you are 22 years old. You may need to have your cholesterol levels checked more often if: ? Your lipid or cholesterol levels are high. ? You are older than 22 years of age. ? You are at high risk for heart disease. What should I know about cancer screening? Many types of  cancers can be detected early and may often be prevented. Lung Cancer  You should be screened every year for lung cancer if: ? You are a current smoker who has smoked for at least 30 years. ? You are a former smoker who has quit within the past 15 years.  Talk to your health care provider about your screening options, when you should start screening, and how often you should be screened. Colorectal Cancer  Routine colorectal cancer screening usually begins at 22 years of age and should be repeated every 5-10 years until you are 22 years old. You may need to be screened more often if early forms of precancerous polyps or small growths are found. Your health care provider may recommend screening at an earlier age if you have risk factors for colon cancer.  Your health care provider may recommend using home test kits to check for hidden blood in the stool.  A small camera at the end of a tube can be used to examine your colon (sigmoidoscopy or colonoscopy). This checks for the earliest forms of colorectal cancer. Prostate and Testicular Cancer  Depending on your age and overall health, your health care provider may do certain tests to screen for prostate and testicular cancer.  Talk to your health care provider about any symptoms or concerns you have about testicular or prostate cancer. Skin Cancer  Check your skin from head to toe regularly.  Tell your health care provider about any new moles or changes in moles, especially if: ? There is a change in  a mole's size, shape, or color. ? You have a mole that is larger than a pencil eraser.  Always use sunscreen. Apply sunscreen liberally and repeat throughout the day.  Protect yourself by wearing long sleeves, pants, a wide-brimmed hat, and sunglasses when outside. What should I know about heart disease, diabetes, and high blood pressure?  If you are 50-52 years of age, have your blood pressure checked every 3-5 years. If you are 33 years  of age or older, have your blood pressure checked every year. You should have your blood pressure measured twice-once when you are at a hospital or clinic, and once when you are not at a hospital or clinic. Record the average of the two measurements. To check your blood pressure when you are not at a hospital or clinic, you can use: ? An automated blood pressure machine at a pharmacy. ? A home blood pressure monitor.  Talk to your health care provider about your target blood pressure.  If you are between 9-40 years old, ask your health care provider if you should take aspirin to prevent heart disease.  Have regular diabetes screenings by checking your fasting blood sugar level. ? If you are at a normal weight and have a low risk for diabetes, have this test once every three years after the age of 73. ? If you are overweight and have a high risk for diabetes, consider being tested at a younger age or more often.  A one-time screening for abdominal aortic aneurysm (AAA) by ultrasound is recommended for men aged 65-75 years who are current or former smokers. What should I know about preventing infection? Hepatitis B If you have a higher risk for hepatitis B, you should be screened for this virus. Talk with your health care provider to find out if you are at risk for hepatitis B infection. Hepatitis C Blood testing is recommended for:  Everyone born from [birth year] through 1964.  Anyone with known risk factors for hepatitis C. Sexually Transmitted Diseases (STDs)  You should be screened each year for STDs including gonorrhea and chlamydia if: ? You are sexually active and are younger than 22 years of age. ? You are older than 22 years of age and your health care provider tells you that you are at risk for this type of infection. ? Your sexual activity has changed since you were last screened and you are at an increased risk for chlamydia or gonorrhea. Ask your health care provider if you are at  risk.  Talk with your health care provider about whether you are at high risk of being infected with HIV. Your health care provider may recommend a prescription medicine to help prevent HIV infection. What else can I do?  Schedule regular health, dental, and eye exams.  Stay current with your vaccines (immunizations).  Do not use any tobacco products, such as cigarettes, chewing tobacco, and e-cigarettes. If you need help quitting, ask your health care provider.  Limit alcohol intake to no more than 2 drinks per day. One drink equals 12 ounces of beer, 5 ounces of wine, or 1 ounces of hard liquor.  Do not use street drugs.  Do not share needles.  Ask your health care provider for help if you need support or information about quitting drugs.  Tell your health care provider if you often feel depressed.  Tell your health care provider if you have ever been abused or do not feel safe at home. This information is not intended  to replace advice given to you by your health care provider. Make sure you discuss any questions you have with your health care provider. Document Released: 09/07/2007 Document Revised: 11/08/2015 Document Reviewed: 12/13/2014 Elsevier Interactive Patient Education  2019 [employer].

## 2018-08-06 NOTE — Progress Notes (Signed)
Subjective:    Patient ID: Micheal Lewis, male    DOB: 1995/05/08, 23 y.o.   MRN: [id number]  HPI He is here for a physical exam.   He is going to school part time.  He works at [employer] and has been working during the pandemic.    He denies changes in health.  He would like to get tested for STDs.  He has no symptoms, but is sexually active.   ADD:  He feels difficulty remembering putting some things down - even if he put it down 5 minutes ago.  He has difficulty focusing at times.  He has been on a few medications in the past and would like to try medication again.  Some days he feels like the attention deficit disorder does not affect him, but most days it does.  He denies any depression at this time.  He does have some intermittent anxiety.  He did do a depression screen and that though some of the questions look positive for depression they are also positive because of his ADD.  Medications and allergies reviewed with patient and updated if appropriate.  Patient Active Problem List   Diagnosis Date Noted  . Screening for STD (sexually transmitted disease) 04/28/2017  . Mood changes 04/28/2017  . Depression 07/30/2015  . ADD (attention deficit disorder) 01/16/2015    No current outpatient medications on file prior to visit.   No current facility-administered medications on file prior to visit.     Past Medical History:  Diagnosis Date  . ADHD (attention deficit hyperactivity disorder)   . Anxiety   . Depression     No past surgical history on file.  Social History   Socioeconomic History  . Marital status: Single    Spouse name: Not on file  . Number of children: 0  . Years of education: Not on file  . Highest education level: High school graduate  Occupational History  . Not on file  Social Needs  . Financial resource strain: Not hard at all  . Food insecurity:    Worry: Never true    Inability: Never true  . Transportation needs:    Medical:  No    Non-medical: No  Tobacco Use  . Smoking status: Never Smoker  . Smokeless tobacco: Never Used  Substance and Sexual Activity  . Alcohol use: No  . Drug use: Yes    Types: Benzodiazepines    Comment: occasional  . Sexual activity: Yes    Birth control/protection: None  Lifestyle  . Physical activity:    Days per week: 7 days    Minutes per session: 30 min  . Stress: Very much  Relationships  . Social connections:    Talks on phone: More than three times a week    Gets together: More than three times a week    Attends religious service: Never    Active member of club or organization: Yes    Attends meetings of clubs or organizations: More than 4 times per year    Relationship status: Living with partner  Other Topics Concern  . Not on file  Social History Narrative   Exercises - run, lifts      College - [employer]    Family History  Problem Relation Age of Onset  . Hyperlipidemia Father   . Hypertension Father   . ADD / ADHD Father   . Post-traumatic stress disorder Mother   . Depression Sister   .  Anxiety disorder Sister   . Depression Maternal Grandmother   . Alcohol abuse Paternal Uncle   . Depression Maternal Grandfather     Review of Systems  Constitutional: Negative for chills and fever.  Eyes: Negative for visual disturbance.  Respiratory: Negative for cough, shortness of breath and wheezing.   Cardiovascular: Negative for chest pain, palpitations and leg swelling.  Gastrointestinal: Positive for abdominal pain (every couple of days RLQ in same spot, stays for 5 minutes) and nausea (rare). Negative for blood in stool, constipation and diarrhea.       No gerd  Genitourinary: Negative for discharge, dysuria, genital sores and hematuria.  Musculoskeletal: Negative for arthralgias and back pain.  Skin: Negative for rash.  Neurological: Negative for light-headedness and headaches.  Psychiatric/Behavioral: Positive for decreased concentration. Negative for  dysphoric mood. The patient is nervous/anxious (intermittent).        Objective:   Vitals:   08/07/18 0908  BP: 120/72  Pulse: 78  Temp: 98.3 F (36.8 C)  SpO2: 99%   Filed Weights   08/07/18 0908  Weight: 170 lb (77.1 kg)   Body mass index is 22.43 kg/m.  Wt Readings from Last 3 Encounters:  08/07/18 170 lb (77.1 kg)  04/28/17 157 lb (71.2 kg)  04/21/17 154 lb (69.9 kg)   Depression screen [facility] 2/9 08/07/2018 04/28/2017 04/02/2016  Decreased Interest 1 1 0  Down, Depressed, Hopeless 1 - 3  PHQ - 2 Score 2 1 3   Altered sleeping 1 1 3   Tired, decreased energy 2 1 2   Change in appetite 0 0 0  Feeling bad or failure about yourself  2 0 3  Trouble concentrating 3 3 3   Moving slowly or fidgety/restless 3 2 0  Suicidal thoughts 2 0 0  PHQ-9 Score 15 8 14       Physical Exam Constitutional: He appears well-developed and well-nourished. No distress.  HENT:  Head: Normocephalic and atraumatic.  Right Ear: External ear normal.  Left Ear: External ear normal.  Mouth/Throat: Oropharynx is clear and moist.  Normal ear canals and TM b/l  Eyes: Conjunctivae and EOM are normal.  Neck: Neck supple. No tracheal deviation present. No thyromegaly present.  No carotid bruit  Cardiovascular: Normal rate, regular rhythm, normal heart sounds and intact distal pulses.   No murmur heard. Pulmonary/Chest: Effort normal and breath sounds normal. No respiratory distress. He has no wheezes. He has no rales.  Abdominal: Soft. He exhibits no distension. There is no tenderness.  Genitourinary: deferred  Musculoskeletal: He exhibits no edema.  Lymphadenopathy:   He has no cervical adenopathy.  Skin: Skin is warm and dry. He is not diaphoretic.  Psychiatric: He has a normal mood and affect. His behavior is normal.  Thinking is normal.  Judgment is normal.        Assessment & Plan:   Physical exam: Screening blood work  ordered Immunizations  Up to date  Exercise he does do some exercise  Weight   normal BMI Skin   no skin concerns Substance abuse   occasional marijuana, no other substance abuse  See Problem List for Assessment and Plan of chronic medical problems.   Follow-up in 1 month

## 2018-08-07 ENCOUNTER — Other Ambulatory Visit (INDEPENDENT_AMBULATORY_CARE_PROVIDER_SITE_OTHER): Payer: POS

## 2018-08-07 ENCOUNTER — Other Ambulatory Visit: Payer: Self-pay

## 2018-08-07 ENCOUNTER — Ambulatory Visit (INDEPENDENT_AMBULATORY_CARE_PROVIDER_SITE_OTHER): Payer: POS | Admitting: Internal Medicine

## 2018-08-07 ENCOUNTER — Encounter: Payer: Self-pay | Admitting: Internal Medicine

## 2018-08-07 VITALS — BP 120/72 | HR 78 | Temp 98.3°F | Ht 73.0 in | Wt 170.0 lb

## 2018-08-07 DIAGNOSIS — F3289 Other specified depressive episodes: Secondary | ICD-10-CM | POA: Diagnosis not present

## 2018-08-07 DIAGNOSIS — Z113 Encounter for screening for infections with a predominantly sexual mode of transmission: Secondary | ICD-10-CM

## 2018-08-07 DIAGNOSIS — Z0000 Encounter for general adult medical examination without abnormal findings: Secondary | ICD-10-CM

## 2018-08-07 DIAGNOSIS — F988 Other specified behavioral and emotional disorders with onset usually occurring in childhood and adolescence: Secondary | ICD-10-CM

## 2018-08-07 LAB — LIPID PANEL
Cholesterol: 136 mg/dL (ref 0–200)
HDL: 37 mg/dL — ABNORMAL LOW (ref >39.00)
LDL Cholesterol: 76 mg/dL (ref 0–99)
NonHDL: 99.05
Total CHOL/HDL Ratio: 4
Triglycerides: 115 mg/dL (ref 0.0–149.0)
VLDL: 23 mg/dL (ref 0.0–40.0)

## 2018-08-07 LAB — COMPREHENSIVE METABOLIC PANEL
ALT: 19 U/L (ref 0–53)
AST: 18 U/L (ref 0–37)
Albumin: 4.5 g/dL (ref 3.5–5.2)
Alkaline Phosphatase: 68 U/L (ref 39–117)
BUN: 12 mg/dL (ref 6–23)
CO2: 28 mEq/L (ref 19–32)
Calcium: 9.3 mg/dL (ref 8.4–10.5)
Chloride: 104 mEq/L (ref 96–112)
Creatinine, Ser: 1.06 mg/dL (ref 0.40–1.50)
GFR: 87.57 mL/min (ref >60.00)
Glucose, Bld: 67 mg/dL — ABNORMAL LOW (ref 70–99)
Potassium: 4.1 mEq/L (ref 3.5–5.1)
Sodium: 141 mEq/L (ref 135–145)
Total Bilirubin: 1.1 mg/dL (ref 0.2–1.2)
Total Protein: 6.9 g/dL (ref 6.0–8.3)

## 2018-08-07 LAB — CBC WITH DIFFERENTIAL/PLATELET
Basophils Absolute: 0.1 10*3/uL (ref 0.0–0.1)
Basophils Relative: 0.9 % (ref 0.0–3.0)
Eosinophils Absolute: 0.2 10*3/uL (ref 0.0–0.7)
Eosinophils Relative: 4.2 % (ref 0.0–5.0)
HCT: 47.5 % (ref 39.0–52.0)
Hemoglobin: 16.5 g/dL (ref 13.0–17.0)
Lymphocytes Relative: 28.7 % (ref 12.0–46.0)
Lymphs Abs: 1.7 10*3/uL (ref 0.7–4.0)
MCHC: 34.8 g/dL (ref 30.0–36.0)
MCV: 87.2 fl (ref 78.0–100.0)
Monocytes Absolute: 0.6 10*3/uL (ref 0.1–1.0)
Monocytes Relative: 9.8 % (ref 3.0–12.0)
Neutro Abs: 3.3 10*3/uL (ref 1.4–7.7)
Neutrophils Relative %: 56.4 % (ref 43.0–77.0)
Platelets: 272 10*3/uL (ref 150.0–400.0)
RBC: 5.44 Mil/uL (ref 4.22–5.81)
RDW: 12.6 % (ref 11.5–15.5)
WBC: 5.8 10*3/uL (ref 4.0–10.5)

## 2018-08-07 LAB — TSH: TSH: 1.42 u[IU]/mL (ref 0.35–4.50)

## 2018-08-07 MED ORDER — AMPHETAMINE-DEXTROAMPHETAMINE 10 MG PO TABS: 10.0000 mg | ORAL_TABLET | Freq: Every day | ORAL | 0 refills | Status: DC

## 2018-08-07 NOTE — Assessment & Plan Note (Signed)
He denies any depression He did have several positive answers to suggest depression on the [facility] PHQ 9 questionnaire.  Some of those questions were positive because of his ADD that is not currently treated He also has a feeling of failure because he is still trying to figure out what he wants to do.  He plans on going back to school and is currently working.  He feels like his parents do not approve.  No evidence of depression on exam or by history

## 2018-08-07 NOTE — Assessment & Plan Note (Signed)
Sexually active No current symptoms He does like to get tested annually HIV, hepatitis C, herpes, RPR ordered

## 2018-08-07 NOTE — Assessment & Plan Note (Signed)
He does want to try medication again.  He was diagnosed when he was a child with ADD and has been on several different medications After discussing options decided we will retry Adderall 10 mg daily He will follow-up in 1 month so we can adjust medication if needed

## 2018-08-10 LAB — RPR: RPR Ser Ql: NONREACTIVE

## 2018-08-10 LAB — HSV 2 ANTIBODY, IGG: HSV 2 Glycoprotein G Ab, IgG: <0.9 index

## 2018-08-10 LAB — HEPATITIS C ANTIBODY
Hepatitis C Ab: NONREACTIVE
SIGNAL TO CUT-OFF: 0.01 (ref <1.00)

## 2018-08-10 LAB — HIV ANTIBODY (ROUTINE TESTING W REFLEX): HIV 1&2 Ab, 4th Generation: NONREACTIVE

## 2018-08-10 LAB — HSV 1 ANTIBODY, IGG: HSV 1 Glycoprotein G Ab, IgG: <0.9 index

## 2018-09-08 NOTE — Progress Notes (Signed)
Subjective:    Patient ID: Micheal Lewis, male    DOB: 1995/08/06, 23 y.o.   MRN: [id number]  HPI The patient is here for follow up.   ADD:  He is taking his medication as prescribed.  We started this one month ago.  He feels the medication is effective.  He has had a side effect of increased anxiety.  He is also stated some decreased appetite, but makes himself eat.  He denies other side effects, including palpitations, headaches, lightheadedness and weight loss.  Anxiety: He has had some increased anxiety.  He denies any depression.  He would be interested in considering a medication for anxiety.   Medications and allergies reviewed with patient and updated if appropriate.  Patient Active Problem List   Diagnosis Date Noted  . Anxiety 09/09/2018  . Screening for STD (sexually transmitted disease) 04/28/2017  . Mood changes 04/28/2017  . Depression 07/30/2015  . ADD (attention deficit disorder) 01/16/2015    No current outpatient medications on file prior to visit.   No current facility-administered medications on file prior to visit.     Past Medical History:  Diagnosis Date  . ADHD (attention deficit hyperactivity disorder)   . Anxiety   . Depression     History reviewed. No pertinent surgical history.  Social History   Socioeconomic History  . Marital status: Single    Spouse name: Not on file  . Number of children: 0  . Years of education: Not on file  . Highest education level: High school graduate  Occupational History  . Not on file  Social Needs  . Financial resource strain: Not hard at all  . Food insecurity    Worry: Never true    Inability: Never true  . Transportation needs    Medical: No    Non-medical: No  Tobacco Use  . Smoking status: Never Smoker  . Smokeless tobacco: Never Used  Substance and Sexual Activity  . Alcohol use: Yes    Comment: occasional  . Drug use: Yes    Types: Marijuana    Comment: occasional  . Sexual activity:  Yes    Birth control/protection: None  Lifestyle  . Physical activity    Days per week: 7 days    Minutes per session: 30 min  . Stress: Very much  Relationships  . Social connections    Talks on phone: More than three times a week    Gets together: More than three times a week    Attends religious service: Never    Active member of club or organization: Yes    Attends meetings of clubs or organizations: More than 4 times per year    Relationship status: Living with partner  Other Topics Concern  . Not on file  Social History Narrative   Exercises - run, lifts      Part time school, works at [employer]    Family History  Problem Relation Age of Onset  . Hyperlipidemia Father   . Hypertension Father   . ADD / ADHD Father   . Post-traumatic stress disorder Mother   . Depression Sister   . Anxiety disorder Sister   . Depression Maternal Grandmother   . Alcohol abuse Paternal Uncle   . Depression Maternal Grandfather     Review of Systems  Constitutional: Positive for appetite change (decreased).  Cardiovascular: Negative for chest pain and palpitations.  Neurological: Negative for headaches.  Psychiatric/Behavioral: Positive for decreased concentration. Negative for  sleep disturbance. The patient is nervous/anxious.        Objective:   Vitals:   09/09/18 0956  BP: 108/72  Pulse: 61  Resp: 16  Temp: (!) 97.5 F (36.4 C)  SpO2: 99%   BP Readings from Last 3 Encounters:  09/09/18 108/72  08/07/18 120/72  04/28/17 124/70   Wt Readings from Last 3 Encounters:  09/09/18 168 lb (76.2 kg)  08/07/18 170 lb (77.1 kg)  04/28/17 157 lb (71.2 kg)   Body mass index is 22.16 kg/m.   Physical Exam Constitutional:      General: He is not in acute distress.    Appearance: Normal appearance. He is not ill-appearing.  Skin:    General: Skin is warm and dry.  Neurological:     Mental Status: He is alert.  Psychiatric:        Mood and Affect: Mood normal.         Behavior: Behavior normal.        Thought Content: Thought content normal.        Judgment: Judgment normal.            Assessment & Plan:    See Problem List for Assessment and Plan of chronic medical problems.

## 2018-09-09 ENCOUNTER — Ambulatory Visit (INDEPENDENT_AMBULATORY_CARE_PROVIDER_SITE_OTHER): Payer: POS | Admitting: Internal Medicine

## 2018-09-09 ENCOUNTER — Encounter: Payer: Self-pay | Admitting: Internal Medicine

## 2018-09-09 ENCOUNTER — Other Ambulatory Visit: Payer: Self-pay

## 2018-09-09 VITALS — BP 108/72 | HR 61 | Temp 97.5°F | Resp 16 | Ht 73.0 in | Wt 168.0 lb

## 2018-09-09 DIAGNOSIS — F988 Other specified behavioral and emotional disorders with onset usually occurring in childhood and adolescence: Secondary | ICD-10-CM

## 2018-09-09 DIAGNOSIS — F419 Anxiety disorder, unspecified: Secondary | ICD-10-CM | POA: Diagnosis not present

## 2018-09-09 MED ORDER — FLUOXETINE HCL 20 MG PO TABS: 20.0000 mg | ORAL_TABLET | Freq: Every day | ORAL | 3 refills | Status: DC

## 2018-09-09 MED ORDER — AMPHETAMINE-DEXTROAMPHETAMINE 10 MG PO TABS: 10.0000 mg | ORAL_TABLET | Freq: Every day | ORAL | 0 refills | Status: DC

## 2018-09-09 NOTE — Patient Instructions (Signed)
   Medications reviewed and updated.  Changes include :   Start fluoxetine.    Your prescription(s) have been submitted to your pharmacy. Please take as directed and contact our office if you believe you are having problem(s) with the medication(s).   Please followup in 6 weeks

## 2018-09-09 NOTE — Assessment & Plan Note (Signed)
Improved with low-dose Adderall We will continue current dose-Adderall 10 mg daily

## 2018-09-09 NOTE — Assessment & Plan Note (Signed)
Experiencing increased anxiety Possible side effect from Adderall We will start fluoxetine 20 mg daily Discussed possible side effects Follow-up in 6 weeks, sooner if needed

## 2018-10-14 ENCOUNTER — Telehealth: Payer: Self-pay | Admitting: Internal Medicine

## 2018-10-14 MED ORDER — AMPHETAMINE-DEXTROAMPHETAMINE 10 MG PO TABS: 10.0000 mg | ORAL_TABLET | Freq: Every day | ORAL | 0 refills | Status: DC

## 2018-10-14 NOTE — Telephone Encounter (Signed)
Check [place] registry last filled 09/09/2018.Marland KitchenJohny Chess

## 2018-10-14 NOTE — Telephone Encounter (Signed)
Medication Refill - Medication: amphetamine-dextroamphetamine (ADDERALL) 10 MG tablet    Has the patient contacted their pharmacy? Yes (Agent: If no, request that the patient contact the pharmacy for the refill.) (Agent: If yes, when and what did the pharmacy advise?)Contact PCP  Preferred Pharmacy (with phone number or street name):  [facility] [place] [place] [address] [phone] (Phone) [phone] (Fax)     Agent: Please be advised that RX refills may take up to 3 business days. We ask that you follow-up with your pharmacy.

## 2018-10-18 NOTE — Progress Notes (Signed)
Subjective:    Patient ID: Micheal Lewis, male    DOB: January 13, 1996, 23 y.o.   MRN: [id number]  HPI The patient is here for follow up.  ADD:  He is taking his medication as prescribed.  He feels the medication is effective.  He denies side effects, including palpitations, headaches, lightheadedness, decreased appetite and weight loss.  After showing him that he had lost weight he did not mention that he probably is eating less, but feels his appetite is good.  He does not weigh himself at home.  Anxiety: He is taking his medication daily as prescribed. He denies any side effects from the medication. He feels too chill sometimes, but once he gets going he feels okay.  He feels his anxiety is well controlled and he is happy with his current dose of medication.   He states minimal depression.  He feels like it is more circumstantial and environmental and he just needs to change his environment.  He does not feel that anything needs to be different at this time.  Medications and allergies reviewed with patient and updated if appropriate.  Patient Active Problem List   Diagnosis Date Noted  . Anxiety 09/09/2018  . Screening for STD (sexually transmitted disease) 04/28/2017  . Depression 07/30/2015  . ADD (attention deficit disorder) 01/16/2015    Current Outpatient Medications on File Prior to Visit  Medication Sig Dispense Refill  . amphetamine-dextroamphetamine (ADDERALL) 10 MG tablet Take 1 tablet (10 mg total) by mouth daily with breakfast. 30 tablet 0  . FLUoxetine (PROZAC) 20 MG tablet Take 1 tablet (20 mg total) by mouth daily. 30 tablet 3   No current facility-administered medications on file prior to visit.     Past Medical History:  Diagnosis Date  . ADHD (attention deficit hyperactivity disorder)   . Anxiety   . Depression     No past surgical history on file.  Social History   Socioeconomic History  . Marital status: Single    Spouse name: Not on file  . Number  of children: 0  . Years of education: Not on file  . Highest education level: High school graduate  Occupational History  . Not on file  Social Needs  . Financial resource strain: Not hard at all  . Food insecurity    Worry: Never true    Inability: Never true  . Transportation needs    Medical: No    Non-medical: No  Tobacco Use  . Smoking status: Never Smoker  . Smokeless tobacco: Never Used  Substance and Sexual Activity  . Alcohol use: Yes    Comment: occasional  . Drug use: Yes    Types: Marijuana    Comment: occasional  . Sexual activity: Yes    Birth control/protection: None  Lifestyle  . Physical activity    Days per week: 7 days    Minutes per session: 30 min  . Stress: Very much  Relationships  . Social connections    Talks on phone: More than three times a week    Gets together: More than three times a week    Attends religious service: Never    Active member of club or organization: Yes    Attends meetings of clubs or organizations: More than 4 times per year    Relationship status: Living with partner  Other Topics Concern  . Not on file  Social History Narrative   Exercises - run, lifts      Part time  school, works at [employer]    Family History  Problem Relation Age of Onset  . Hyperlipidemia Father   . Hypertension Father   . ADD / ADHD Father   . Post-traumatic stress disorder Mother   . Depression Sister   . Anxiety disorder Sister   . Depression Maternal Grandmother   . Alcohol abuse Paternal Uncle   . Depression Maternal Grandfather     Review of Systems  Constitutional: Negative for appetite change.  HENT: Positive for congestion (mild, intermittent).   Respiratory: Positive for cough (mild, intermittent).   Cardiovascular: Negative for chest pain and palpitations.  Gastrointestinal: Negative for nausea.  Neurological: Negative for light-headedness and headaches.  Psychiatric/Behavioral: Positive for dysphoric mood (mild).  Negative for sleep disturbance. The patient is nervous/anxious (controlled).        Objective:   Vitals:   10/20/18 1117  BP: 124/76  Pulse: 66  Resp: 13  Temp: 98.3 F (36.8 C)  SpO2: 98%   BP Readings from Last 3 Encounters:  10/20/18 124/76  09/09/18 108/72  08/07/18 120/72   Wt Readings from Last 3 Encounters:  10/20/18 159 lb 12 oz (72.5 kg)  09/09/18 168 lb (76.2 kg)  08/07/18 170 lb (77.1 kg)   Body mass index is 21.08 kg/m.   Physical Exam Constitutional:      General: He is not in acute distress.    Appearance: Normal appearance. He is not ill-appearing.  HENT:     Head: Normocephalic and atraumatic.  Skin:    General: Skin is warm and dry.  Neurological:     Mental Status: He is alert.  Psychiatric:        Mood and Affect: Mood normal.        Behavior: Behavior normal.        Thought Content: Thought content normal.        Judgment: Judgment normal.            Assessment & Plan:    See Problem List for Assessment and Plan of chronic medical problems.

## 2018-10-20 ENCOUNTER — Other Ambulatory Visit: Payer: Self-pay

## 2018-10-20 ENCOUNTER — Ambulatory Visit (INDEPENDENT_AMBULATORY_CARE_PROVIDER_SITE_OTHER): Payer: POS | Admitting: Internal Medicine

## 2018-10-20 ENCOUNTER — Encounter: Payer: Self-pay | Admitting: Internal Medicine

## 2018-10-20 VITALS — BP 124/76 | HR 66 | Temp 98.3°F | Resp 13 | Ht 73.0 in | Wt 159.8 lb

## 2018-10-20 DIAGNOSIS — F419 Anxiety disorder, unspecified: Secondary | ICD-10-CM

## 2018-10-20 DIAGNOSIS — F988 Other specified behavioral and emotional disorders with onset usually occurring in childhood and adolescence: Secondary | ICD-10-CM | POA: Diagnosis not present

## 2018-10-20 MED ORDER — FLUOXETINE HCL 20 MG PO TABS: 20.0000 mg | ORAL_TABLET | Freq: Every day | ORAL | 5 refills | Status: DC

## 2018-10-20 NOTE — Assessment & Plan Note (Addendum)
Taking adderall daily - no side effects Has lost weight but he feels his appetite is good.  States he may be eating less Will monitor weight Feels medication is working well Will continue current Adderall at current dose Follow-up in 6 months

## 2018-10-20 NOTE — Assessment & Plan Note (Addendum)
improved, controlled Discussed options of increasing, decreasing or maintaining current dose of fluoxetine We will continue fluoxetine 20 mg daily Follow-up in 6 months, but he will contact me sooner with any concerns or questions

## 2018-11-16 ENCOUNTER — Telehealth: Payer: Self-pay | Admitting: Internal Medicine

## 2018-11-16 NOTE — Telephone Encounter (Signed)
Patient would like a refill on his amphetamine-dextroamphetamine (ADDERALL) 10 MG tablet   medication and have it sent to [facility] preferred pharmacy [facility] [place], [place]

## 2018-11-17 ENCOUNTER — Telehealth: Payer: Self-pay | Admitting: Internal Medicine

## 2018-11-17 MED ORDER — AMPHETAMINE-DEXTROAMPHETAMINE 10 MG PO TABS: 10.0000 mg | ORAL_TABLET | Freq: Every day | ORAL | 0 refills | Status: DC

## 2018-11-17 MED ORDER — FLUOXETINE HCL 20 MG PO CAPS: 20.0000 mg | ORAL_CAPSULE | Freq: Every day | ORAL | 3 refills | Status: DC

## 2018-11-17 NOTE — Telephone Encounter (Signed)
Last refill was 10/14/18 Last OV 10/20/18

## 2018-11-17 NOTE — Telephone Encounter (Signed)
Spoke with pt and advised that an rx for same medication in capsule form was sent per pharmacy to see if that would be covered.

## 2018-11-17 NOTE — Telephone Encounter (Signed)
Pt called and stated that he will need a PA for medicationFLUoxetine (PROZAC) 20 MG tablet [id number]

## 2018-12-24 ENCOUNTER — Telehealth: Payer: Self-pay | Admitting: Internal Medicine

## 2018-12-24 MED ORDER — AMPHETAMINE-DEXTROAMPHETAMINE 10 MG PO TABS: 10.0000 mg | ORAL_TABLET | Freq: Every day | ORAL | 0 refills | Status: DC

## 2018-12-24 NOTE — Telephone Encounter (Signed)
Copied from [place] [id number]. Topic: Quick Communication - Rx Refill/Question >> Dec 24, 2018 11:53 AM Yvette Rack wrote: Medication: amphetamine-dextroamphetamine (ADDERALL) 10 MG tablet  Has the patient contacted their pharmacy? no  Preferred Pharmacy (with phone number or street name): [facility] [place] [place] [address] [phone] (Phone) [phone] (Fax)  Agent: Please be advised that RX refills may take up to 3 business days. We ask that you follow-up with your pharmacy.

## 2019-02-08 ENCOUNTER — Other Ambulatory Visit: Payer: Self-pay | Admitting: Internal Medicine

## 2019-02-08 NOTE — Telephone Encounter (Signed)
Routing to CMA 

## 2019-02-08 NOTE — Telephone Encounter (Signed)
Patient would like a refill on his amphetamine-dextroamphetamine (ADDERALL) 10 MG tablet medication and have it sent to [facility] [address] [facility] [place].

## 2019-02-08 NOTE — Telephone Encounter (Signed)
Medication is not delegated. Routing to provider office for review

## 2019-02-09 MED ORDER — AMPHETAMINE-DEXTROAMPHETAMINE 10 MG PO TABS: 10.0000 mg | ORAL_TABLET | Freq: Every day | ORAL | 0 refills | Status: DC

## 2019-02-09 NOTE — Telephone Encounter (Signed)
Check [place] registry last filled 12/24/2018.Marland KitchenJohny Lewis

## 2019-03-24 ENCOUNTER — Other Ambulatory Visit: Payer: Self-pay | Admitting: Internal Medicine

## 2019-03-29 ENCOUNTER — Telehealth: Payer: Self-pay

## 2019-03-29 MED ORDER — AMPHETAMINE-DEXTROAMPHETAMINE 10 MG PO TABS: 10.0000 mg | ORAL_TABLET | Freq: Every day | ORAL | 0 refills | Status: DC

## 2019-03-29 NOTE — Telephone Encounter (Signed)
Last refill 02/09/19 Last OV 10/20/18

## 2019-03-29 NOTE — Telephone Encounter (Signed)
Medication Refill - Medication: amphetamine-dextroamphetamine (ADDERALL) 10 MG tablet   Has the patient contacted their pharmacy? Yes.   (Agent: If no, request that the patient contact the pharmacy for the refill.) (Agent: If yes, when and what did the pharmacy advise?)  Preferred Pharmacy (with phone number or street name): [facility] [place] [place] - [address]  Agent: Please be advised that RX refills may take up to 3 business days. We ask that you follow-up with your pharmacy.

## 2019-04-22 ENCOUNTER — Ambulatory Visit: Payer: POS | Admitting: Internal Medicine

## 2019-04-23 ENCOUNTER — Ambulatory Visit: Payer: POS | Admitting: Internal Medicine

## 2019-04-27 ENCOUNTER — Ambulatory Visit (INDEPENDENT_AMBULATORY_CARE_PROVIDER_SITE_OTHER): Payer: BC Managed Care – PPO | Admitting: Internal Medicine

## 2019-04-27 ENCOUNTER — Encounter: Payer: Self-pay | Admitting: Gastroenterology

## 2019-04-27 ENCOUNTER — Encounter: Payer: Self-pay | Admitting: Internal Medicine

## 2019-04-27 ENCOUNTER — Other Ambulatory Visit: Payer: Self-pay

## 2019-04-27 VITALS — BP 118/70 | HR 77 | Temp 98.4°F | Resp 16 | Ht 73.0 in | Wt 168.0 lb

## 2019-04-27 DIAGNOSIS — F419 Anxiety disorder, unspecified: Secondary | ICD-10-CM | POA: Diagnosis not present

## 2019-04-27 DIAGNOSIS — F988 Other specified behavioral and emotional disorders with onset usually occurring in childhood and adolescence: Secondary | ICD-10-CM

## 2019-04-27 DIAGNOSIS — K625 Hemorrhage of anus and rectum: Secondary | ICD-10-CM | POA: Insufficient documentation

## 2019-04-27 MED ORDER — AMPHETAMINE-DEXTROAMPHETAMINE 10 MG PO TABS: 10.0000 mg | ORAL_TABLET | Freq: Every day | ORAL | 0 refills | Status: DC

## 2019-04-27 NOTE — Assessment & Plan Note (Signed)
New He has been experiencing rectal bleeding for the past year or so-he assumed this was hemorrhoidal and this has not been evaluated in the past Sees blood on the tissue or on the stool Also mentions more recent right mid quadrant pain that is intermittent without obvious pattern Has several stools a day that are soft-no change Bleeding likely hemorrhoidal, but given persistent nature and right-sided abdominal pain will refer to GI for further evaluation

## 2019-04-27 NOTE — Progress Notes (Signed)
Subjective:    Patient ID: Micheal Lewis, male    DOB: [birth year], 23 y.o.   MRN: [id number]  HPI The patient is here for follow up of their chronic medical problems.   ADD:  He is taking his medication as prescribed.  He feels the medication is effective.  He denies side effects, including palpitations, headaches, lightheadedness, decreased appetite and weight loss.   Anxiety: He is taking his medication daily as prescribed. He denies any side effects from the medication. He feels his anxiety is well controlled and he is happy with his current dose of medication.  On occasion he will experience depression, but feels that situational and not a concern.  Recently he has had some right sided intermittent abdominal pain.  He points to the right mid abdomen.  He is unsure if there is any pattern to it or if it is related to the bowel movements or gas.  He denies any urinary symptoms.  For almost 1 year he has been experiencing rectal bleeding that he assumed was hemorrhoidal in nature.  He sees it on the tissue and occasionally on the stool.  He states that he has multiple soft stools a day and that is normal for him.  There has been no changes in his bowel habits.      Medications and allergies reviewed with patient and updated if appropriate.  Patient Active Problem List   Diagnosis Date Noted  . Anxiety 09/09/2018  . Screening for STD (sexually transmitted disease) 04/28/2017  . Depression 07/30/2015  . ADD (attention deficit disorder) 01/16/2015    Current Outpatient Medications on File Prior to Visit  Medication Sig Dispense Refill  . amphetamine-dextroamphetamine (ADDERALL) 10 MG tablet Take 1 tablet (10 mg total) by mouth daily with breakfast. 30 tablet 0  . FLUoxetine (PROZAC) 20 MG capsule TAKE 1 CAPSULE BY MOUTH EVERY DAY 90 capsule 2   No current facility-administered medications on file prior to visit.    Past Medical History:  Diagnosis Date  . ADHD (attention  deficit hyperactivity disorder)   . Anxiety   . Depression     History reviewed. No pertinent surgical history.  Social History   Socioeconomic History  . Marital status: Single    Spouse name: Not on file  . Number of children: 0  . Years of education: Not on file  . Highest education level: High school graduate  Occupational History  . Not on file  Tobacco Use  . Smoking status: Never Smoker  . Smokeless tobacco: Never Used  Substance and Sexual Activity  . Alcohol use: Yes    Comment: occasional  . Drug use: Yes    Types: Marijuana    Comment: occasional  . Sexual activity: Yes    Birth control/protection: None  Other Topics Concern  . Not on file  Social History Narrative   Exercises - run, lifts      Part time school, works at [employer] Strain:   . Difficulty of Paying Living Expenses: Not on file  Food Insecurity:   . Worried About Charity fundraiser in the Last Year: Not on file  . Ran Out of Food in the Last Year: Not on file  Transportation Needs:   . Lack of Transportation (Medical): Not on file  . Lack of Transportation (Non-Medical): Not on file  Physical Activity:   . Days of Exercise per Week: Not on file  .  Minutes of Exercise per Session: Not on file  Stress:   . Feeling of Stress : Not on file  Social Connections:   . Frequency of Communication with Friends and Family: Not on file  . Frequency of Social Gatherings with Friends and Family: Not on file  . Attends Religious Services: Not on file  . Active Member of Clubs or Organizations: Not on file  . Attends Archivist Meetings: Not on file  . Marital Status: Not on file    Family History  Problem Relation Age of Onset  . Hyperlipidemia Father   . Hypertension Father   . ADD / ADHD Father   . Post-traumatic stress disorder Mother   . Depression Sister   . Anxiety disorder Sister   . Depression Maternal  Grandmother   . Alcohol abuse Paternal Uncle   . Depression Maternal Grandfather     Review of Systems  Constitutional: Negative for appetite change (flucuates), chills, fever and unexpected weight change.  Cardiovascular: Negative for chest pain and palpitations.  Gastrointestinal: Positive for abdominal pain (right lower abdomen intermittently) and anal bleeding (? hemorroidal). Negative for blood in stool, constipation, diarrhea and nausea.       Multiple soft stools a day  Genitourinary: Negative for difficulty urinating, dysuria and hematuria.  Neurological: Positive for headaches (stress related). Negative for light-headedness.  Psychiatric/Behavioral: The patient is nervous/anxious (controlled).        Objective:   Vitals:   04/27/19 1111  BP: 118/70  Pulse: 77  Resp: 16  Temp: 98.4 F (36.9 C)  SpO2: 97%   BP Readings from Last 3 Encounters:  04/27/19 118/70  10/20/18 124/76  09/09/18 108/72   Wt Readings from Last 3 Encounters:  04/27/19 168 lb (76.2 kg)  10/20/18 159 lb 12 oz (72.5 kg)  09/09/18 168 lb (76.2 kg)   Body mass index is 22.16 kg/m.   Physical Exam    Constitutional: Appears well-developed and well-nourished. No distress.  HENT:  Head: Normocephalic and atraumatic.  Neck: Neck supple. No tracheal deviation present. No thyromegaly present.  No cervical lymphadenopathy Cardiovascular: Normal rate, regular rhythm and normal heart sounds.   No murmur heard. No carotid bruit .  No edema Pulmonary/Chest: Effort normal and breath sounds normal. No respiratory distress. No has no wheezes. No rales. Abdomen: Soft, nontender, nondistended Rectal: Deferred to gastroenterology Skin: Skin is warm and dry. Not diaphoretic.  Psychiatric: Normal mood and affect. Behavior is normal.      Assessment & Plan:    See Problem List for Assessment and Plan of chronic medical problems.    This visit occurred during the SARS-CoV-2 public health emergency.   Safety protocols were in place, including screening questions prior to the visit, additional usage of staff PPE, and extensive cleaning of exam room while observing appropriate contact time as indicated for disinfecting solutions.

## 2019-04-27 NOTE — Assessment & Plan Note (Signed)
Chronic Controlled, stable Continue current dose of medication-Adderall 10 mg daily Follow-up in 6 months

## 2019-04-27 NOTE — Patient Instructions (Addendum)
   Medications reviewed and updated.  Changes include :   none  Your prescription(s) have been submitted to your pharmacy. Please take as directed and contact our office if you believe you are having problem(s) with the medication(s).   A referral was ordered for GI - colon specialists.   Please followup in 6 months

## 2019-04-27 NOTE — Assessment & Plan Note (Signed)
Chronic Controlled, stable Continue current dose of medication-Prozac 20 mg daily

## 2019-05-25 ENCOUNTER — Encounter: Payer: Self-pay | Admitting: Gastroenterology

## 2019-05-25 ENCOUNTER — Ambulatory Visit (INDEPENDENT_AMBULATORY_CARE_PROVIDER_SITE_OTHER): Payer: BC Managed Care – PPO | Admitting: Gastroenterology

## 2019-05-25 ENCOUNTER — Other Ambulatory Visit: Payer: Self-pay

## 2019-05-25 VITALS — BP 90/58 | HR 80 | Temp 98.9°F | Ht 72.0 in | Wt 169.0 lb

## 2019-05-25 DIAGNOSIS — K602 Anal fissure, unspecified: Secondary | ICD-10-CM

## 2019-05-25 DIAGNOSIS — K625 Hemorrhage of anus and rectum: Secondary | ICD-10-CM | POA: Diagnosis not present

## 2019-05-25 DIAGNOSIS — R1031 Right lower quadrant pain: Secondary | ICD-10-CM

## 2019-05-25 DIAGNOSIS — R195 Other fecal abnormalities: Secondary | ICD-10-CM | POA: Diagnosis not present

## 2019-05-25 MED ORDER — AMBULATORY NON FORMULARY MEDICATION: 1 refills | Status: DC

## 2019-05-25 NOTE — Patient Instructions (Addendum)
You have an anal fissure. I recommend the following: - Sitz baths two to three times daily for pain relief - High fiber diet - increasing both dietary fiber (35 grams daily) and water intake. Please see the handout given to you today.  - Use Citrucel or Metamucil daily to keep stools soft and bulky - Start using Nitroglycerin 0.125% gel applied to the rectum TID x 6-8 weeks. Do not use this with concurrent phosphodiesterase inhibitors like Viagra.  - Colonoscopy in 8 weeks. We will set a reminder in your chart to call and schedule.  - Please call with any questions or concerns before that time.   We have sent a prescription for nitroglycerin 0.125% gel to [facility]. You should apply a pea size amount to your rectum three times daily x 6-8 weeks. Insert a gloved finger inside the rectum to the first knuckle (after your finger nail)  [facility] information is below: Address: [address], [place], [place] [address]  [phone]  *Please DO NOT go directly from our office to pick up this medication! Give the pharmacy 1 day to process the prescription as this is compounded at takes time to make.   How to Take a [employer] A sitz bath is a warm water bath that may be used to care for your rectum, genital area, or the area between your rectum and genitals (perineum). For a sitz bath, the water only comes up to your hips and covers your buttocks. A sitz bath may done at home in a bathtub or with a portable sitz bath that fits over the toilet. Your health care provider may recommend a sitz bath to help:  Relieve pain and discomfort after delivering a baby.  Relieve pain and itching from hemorrhoids or anal fissures.  Relieve pain after certain surgeries.  Relax muscles that are sore or tight. How to take a sitz bath Take 3-4 sitz baths a day, or as many as told by your health care provider. Bathtub sitz bath To take a sitz bath in a bathtub: 1. Partially fill a  bathtub with warm water. The water should be deep enough to cover your hips and buttocks when you are sitting in the tub. 2. If your health care provider told you to put medicine in the water, follow his or her instructions. 3. Sit in the water. 4. Open the tub drain a little, and leave it open during your bath. 5. Turn on the warm water again, enough to replace the water that is draining out. Keep the water running throughout your bath. This helps keep the water at the right level and the right temperature. 6. Soak in the water for 15-20 minutes, or as long as told by your health care provider. 7. When you are done, be careful when you stand up. You may feel dizzy. 8. After the sitz bath, pat yourself dry. Do not rub your skin to dry it.  Over-the-toilet sitz bath To take a sitz bath with an over-the-toilet basin: 1. Follow the manufacturer's instructions. 2. Fill the basin with warm water. 3. If your health care provider told you to put medicine in the water, follow his or her instructions. 4. Sit on the seat. Make sure the water covers your buttocks and perineum. 5. Soak in the water for 15-20 minutes, or as long as told by your health care provider. 6. After the sitz bath, pat yourself dry. Do not rub your skin to dry it. 7.  Clean and dry the basin between uses. 8. Discard the basin if it cracks, or according to the manufacturer's instructions. Contact a health care provider if:  Your symptoms get worse. Do not continue with sitz baths if your symptoms get worse.  You have new symptoms. If this happens, do not continue with sitz baths until you talk with your health care provider. Summary  A sitz bath is a warm water bath in which the water only comes up to your hips and covers your buttocks.  A sitz bath may help relieve itching, relieve pain, and relax muscles that are sore or tight in the lower part of your body, including your genital area.  Take 3-4 sitz baths a day, or as many  as told by your health care provider. Soak in the water for 15-20 minutes.  Do not continue with sitz baths if your symptoms get worse. This information is not intended to replace advice given to you by your health care provider. Make sure you discuss any questions you have with your health care provider. Document Revised: 08/10/2018 Document Reviewed: 03/13/2017 Elsevier Patient Education  [address].

## 2019-05-25 NOTE — Progress Notes (Signed)
Referring Provider: Binnie Rail, MD Primary Care Physician:  Binnie Rail, MD  Reason for Consultation:  Rectal bleeding   IMPRESSION:  Rectal bleeding due to anterior anal fissure Multiple loose bowel movements daily External and suspected internal hemorrhoids Intermittent abdominal pain No extra GI manifestations of IBD No known family history of IBD, autoimmune disease, colon cancer or polyps  Anterior anal fissure seen on rectal exam. Given the concurrent features, must exclude concurrent IBD.    PLAN: Sitz baths two to three times daily for pain relief High fiber diet - increasing both dietary fiber (35 grams daily) and water intake  Citrucel or Metamucil daily, add Miralax 17 g daily if needed to keep stools soft Nitroglycerin 0.125% gel applied to the rectum TID x 6-8 weeks (not to be used with concurrent phosphodiesterase inhibitors) Return in 6-8 weeks if symptoms are not improving  Colonoscopy  The nature of the procedure, as well as the risks, benefits, and alternatives were carefully and thoroughly reviewed with the patient. Ample time for discussion and questions allowed. The patient understood, was satisfied, and agreed to proceed.  Please see the "Patient Instructions" section for addition details about the plan.  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  He is referred by Dr. Quay Burow for rectal bleeding. He has anxiety and depression.  The history is obtained through the patient and review of his electronic health record  Over one year history of intermittent rectal bleeding with every bowel movement.  Sees blood on the tissue or on the stool. Associated rectal pain. Several soft bowel movements daily. Baseline 3-4 loose stools daily. Frequent straining.  No recent foreign bodies placed in the rectum. No trauma.   Also reports intermittent, non-radiating right mid quadrant abdominal pain. Not associated with rectal bleeding, eating,  movement or defecation.   Strong family history of alcoholism. No known family history of IBD or autoimmune disease. No known family history of colon cancer or polyps. No family history of uterine/endometrial cancer, pancreatic cancer or gastric/stomach cancer.  Labs 08/07/18: hgb 16.5, MCV 87.2, RDW 12.6, platelets 272  Past Medical History:  Diagnosis Date  . ADHD (attention deficit hyperactivity disorder)   . Anxiety   . Depression     History reviewed. No pertinent surgical history.  Current Outpatient Medications  Medication Sig Dispense Refill  . amphetamine-dextroamphetamine (ADDERALL) 10 MG tablet Take 1 tablet (10 mg total) by mouth daily with breakfast. 30 tablet 0  . FLUoxetine (PROZAC) 20 MG capsule TAKE 1 CAPSULE BY MOUTH EVERY DAY 90 capsule 2  . AMBULATORY NON FORMULARY MEDICATION Medication Name: Nitroglycerin Ointment 0.125 apply rectally mg three times a day for 6-8 weeks. 30 g 1   No current facility-administered medications for this visit.    Allergies as of 05/25/2019  . (No Known Allergies)    Family History  Problem Relation Age of Onset  . Hyperlipidemia Father   . Hypertension Father   . ADD / ADHD Father   . Post-traumatic stress disorder Mother   . Depression Sister   . Anxiety disorder Sister   . Depression Maternal Grandmother   . Alcohol abuse Paternal Uncle   . Depression Maternal Grandfather     Social History   Socioeconomic History  . Marital status: Single    Spouse name: Not on file  . Number of children: 0  . Years of education: Not on file  . Highest education level: High school graduate  Occupational History  .  Not on file  Tobacco Use  . Smoking status: Never Smoker  . Smokeless tobacco: Never Used  Substance and Sexual Activity  . Alcohol use: Yes    Comment: occasional  . Drug use: Yes    Types: Marijuana    Comment: occasional  . Sexual activity: Yes    Birth control/protection: None  Other Topics Concern  . Not  on file  Social History Narrative   Exercises - run, lifts      Part time school, works at [employer] Strain:   . Difficulty of Paying Living Expenses: Not on file  Food Insecurity:   . Worried About Charity fundraiser in the Last Year: Not on file  . Ran Out of Food in the Last Year: Not on file  Transportation Needs:   . Lack of Transportation (Medical): Not on file  . Lack of Transportation (Non-Medical): Not on file  Physical Activity:   . Days of Exercise per Week: Not on file  . Minutes of Exercise per Session: Not on file  Stress:   . Feeling of Stress : Not on file  Social Connections:   . Frequency of Communication with Friends and Family: Not on file  . Frequency of Social Gatherings with Friends and Family: Not on file  . Attends Religious Services: Not on file  . Active Member of Clubs or Organizations: Not on file  . Attends Archivist Meetings: Not on file  . Marital Status: Not on file  Intimate Partner Violence:   . Fear of Current or Ex-Partner: Not on file  . Emotionally Abused: Not on file  . Physically Abused: Not on file  . Sexually Abused: Not on file    Review of Systems: 12 system ROS is negative except as noted above.   Physical Exam: General:   Alert,  well-nourished, pleasant and cooperative in NAD Head:  Normocephalic and atraumatic. Eyes:  Sclera clear, no icterus.   Conjunctiva pink. Ears:  Normal auditory acuity. Nose:  No deformity, discharge,  or lesions. Mouth:  No deformity or lesions.   Neck:  Supple; no masses or thyromegaly. Lungs:  Clear throughout to auscultation.   No wheezes. Heart:  Regular rate and rhythm; no murmurs. Abdomen:  Soft, nontender, nondistended, normal bowel sounds, no rebound or guarding. No hepatosplenomegaly.   Rectal:   No chemical dermatitis. Non-bleeding anterior anal fissure identified. See attached photo. Non-thrombosed, non-tender  external hemorrhoids present. No fistula. No prolapsing hemorrhoids. No rectal prolapse. Normal anocutaneous reflex. No stool in the rectal vault. No mass or fecal impaction. Normal anal resting tone.  Chaperone: Magda Paganini Msk:  Symmetrical. No boney deformities LAD: No inguinal or umbilical LAD Extremities:  No clubbing or edema. Neurologic:  Alert and  oriented x4;  grossly nonfocal Skin:  No rash or bruise Psych:  Alert and cooperative. Normal mood and affect.    Adylynn Hertenstein L. Tarri Glenn, MD, MPH 05/25/2019, 1:03 PM

## 2019-05-25 NOTE — Progress Notes (Signed)
Anterior anal fissure

## 2019-06-16 ENCOUNTER — Other Ambulatory Visit: Payer: Self-pay | Admitting: Internal Medicine

## 2019-06-16 MED ORDER — AMPHETAMINE-DEXTROAMPHETAMINE 10 MG PO TABS: 10.0000 mg | ORAL_TABLET | Freq: Every day | ORAL | 0 refills | Status: DC

## 2019-06-16 NOTE — Telephone Encounter (Signed)
° ° °  1.Medication Requested:amphetamine-dextroamphetamine (ADDERALL) 10 MG tablet  2. Pharmacy (Name, Street, City):[id number] - [place], [place] - [address]  3. On Med List: yes  4. Last Visit with PCP: 04/27/19  5. Next visit date with PCP: 10/25/19   Agent: Please be advised that RX refills may take up to 3 business days. We ask that you follow-up with your pharmacy.  

## 2019-06-16 NOTE — Telephone Encounter (Signed)
Last OV 04/27/19 Next OV 10/25/19 Last RF 05/04/19

## 2019-06-18 ENCOUNTER — Ambulatory Visit: Payer: BC Managed Care – PPO | Attending: Internal Medicine

## 2019-06-18 DIAGNOSIS — Z23 Encounter for immunization: Secondary | ICD-10-CM

## 2019-06-18 NOTE — Progress Notes (Signed)
   Covid-19 Vaccination Clinic  Name:  Samul Kreyling    MRN: [id number] DOB: 06/18/1995  06/18/2019  Mr. Moody was observed post Covid-19 immunization for 15 minutes without incident. He was provided with Vaccine Information Sheet and instruction to access the V-Safe system.   Mr. Shillito was instructed to call 911 with any severe reactions post vaccine: Marland Kitchen Difficulty breathing  . Swelling of face and throat  . A fast heartbeat  . A bad rash all over body  . Dizziness and weakness   Immunizations Administered    Name Date Dose VIS Date Route   Pfizer COVID-19 Vaccine 06/18/2019  1:25 PM 0.3 mL 03/05/2019 Intramuscular   Manufacturer: [place]   Lot: [id number]   [place]: [id number]

## 2019-07-13 ENCOUNTER — Ambulatory Visit: Payer: BC Managed Care – PPO | Attending: Internal Medicine

## 2019-07-13 DIAGNOSIS — Z23 Encounter for immunization: Secondary | ICD-10-CM

## 2019-07-13 NOTE — Progress Notes (Signed)
   Covid-19 Vaccination Clinic  Name:  Micheal Lewis    MRN: [id number] DOB: [birth year]  07/13/2019  [name] was observed post Covid-19 immunization for 15 minutes without incident. He was provided with Vaccine Information Sheet and instruction to access the V-Safe system.   [name] was instructed to call 911 with any severe reactions post vaccine: Marland Kitchen Difficulty breathing  . Swelling of face and throat  . A fast heartbeat  . A bad rash all over body  . Dizziness and weakness   Immunizations Administered    Name Date Dose VIS Date Route   Pfizer COVID-19 Vaccine 07/13/2019 12:13 PM 0.3 mL 05/19/2018 Intramuscular   Manufacturer: [place]   Lot: [id number]   [place]: [id number]

## 2019-07-18 ENCOUNTER — Encounter (HOSPITAL_COMMUNITY): Payer: Self-pay

## 2019-07-18 ENCOUNTER — Emergency Department (HOSPITAL_COMMUNITY)
Admission: EM | Admit: 2019-07-18 | Discharge: 2019-07-19 | Disposition: A | Discharge status: Home / Self Care | Payer: BC Managed Care – PPO | Attending: Emergency Medicine | Admitting: Emergency Medicine

## 2019-07-18 ENCOUNTER — Emergency Department (HOSPITAL_COMMUNITY): Payer: BC Managed Care – PPO

## 2019-07-18 ENCOUNTER — Other Ambulatory Visit: Payer: Self-pay

## 2019-07-18 DIAGNOSIS — Y9351 Activity, roller skating (inline) and skateboarding: Secondary | ICD-10-CM | POA: Diagnosis not present

## 2019-07-18 DIAGNOSIS — S42401A Unspecified fracture of lower end of right humerus, initial encounter for closed fracture: Secondary | ICD-10-CM | POA: Diagnosis not present

## 2019-07-18 DIAGNOSIS — Z79899 Other long term (current) drug therapy: Secondary | ICD-10-CM | POA: Diagnosis not present

## 2019-07-18 DIAGNOSIS — Y929 Unspecified place or not applicable: Secondary | ICD-10-CM | POA: Insufficient documentation

## 2019-07-18 DIAGNOSIS — Y999 Unspecified external cause status: Secondary | ICD-10-CM | POA: Insufficient documentation

## 2019-07-18 DIAGNOSIS — S59801A Other specified injuries of right elbow, initial encounter: Secondary | ICD-10-CM | POA: Diagnosis present

## 2019-07-18 NOTE — ED Triage Notes (Signed)
Pt reports R elbow pain after falling on it while roller skating. Denies LOC or head injury. Able to move fingers freely, but feels a shooting pain from elbow down forearm and limited ROM at elbow.

## 2019-07-19 ENCOUNTER — Telehealth: Payer: Self-pay

## 2019-07-19 ENCOUNTER — Other Ambulatory Visit: Payer: Self-pay

## 2019-07-19 DIAGNOSIS — S42401A Unspecified fracture of lower end of right humerus, initial encounter for closed fracture: Secondary | ICD-10-CM | POA: Diagnosis not present

## 2019-07-19 MED ORDER — HYDROCODONE-ACETAMINOPHEN 5-325 MG PO TABS: 2.0000 | ORAL_TABLET | ORAL | 0 refills | Status: DC | PRN

## 2019-07-19 MED ORDER — HYDROCODONE-ACETAMINOPHEN 5-325 MG PO TABS
1.0000 | ORAL_TABLET | Freq: Once | ORAL | Status: AC
  Administered 2019-07-19: 1 via ORAL
  Filled 2019-07-19: qty 1

## 2019-07-19 NOTE — Telephone Encounter (Signed)
Last RF 06/16/19

## 2019-07-19 NOTE — ED Provider Notes (Signed)
[facility] DEPT Provider Note   CSN: [id number] Arrival date & time: 07/18/19  2054     History Chief Complaint  Patient presents with  . Elbow Pain    Micheal Lewis is a 24 y.o. male who presents with right elbow pain.  Patient states that he was rollerskating and fell with an outstretched hand and his right elbow hyperextended and he felt like it may have dislocated.  Since then he has been having pain and swelling of the right elbow.  He has limited range of motion of the elbow and it hurts and shoots down his arm when he tries to move it.  He denies numbness, tingling, weakness.  HPI     Past Medical History:  Diagnosis Date  . ADHD (attention deficit hyperactivity disorder)   . Anxiety   . Depression     Patient Active Problem List   Diagnosis Date Noted  . Rectal bleeding 04/27/2019  . Anxiety 09/09/2018  . Depression 07/30/2015  . ADD (attention deficit disorder) 01/16/2015    History reviewed. No pertinent surgical history.     Family History  Problem Relation Age of Onset  . Hyperlipidemia Father   . Hypertension Father   . ADD / ADHD Father   . Post-traumatic stress disorder Mother   . Depression Sister   . Anxiety disorder Sister   . Depression Maternal Grandmother   . Alcohol abuse Paternal Uncle   . Depression Maternal Grandfather     Social History   Tobacco Use  . Smoking status: Never Smoker  . Smokeless tobacco: Never Used  Substance Use Topics  . Alcohol use: Yes    Comment: occasional  . Drug use: Yes    Types: Marijuana    Comment: occasional    Home Medications Prior to Admission medications   Medication Sig Start Date End Date Taking? Authorizing Provider  AMBULATORY NON FORMULARY MEDICATION Medication Name: Nitroglycerin Ointment 0.125 apply rectally mg three times a day for 6-8 weeks. 05/25/19   Thornton Park, MD  amphetamine-dextroamphetamine (ADDERALL) 10 MG tablet Take 1 tablet (10 mg  total) by mouth daily with breakfast. 06/16/19   Binnie Rail, MD  FLUoxetine (PROZAC) 20 MG capsule TAKE 1 CAPSULE BY MOUTH EVERY DAY 03/24/19   Binnie Rail, MD    Allergies    Patient has no known allergies.  Review of Systems   Review of Systems  Musculoskeletal: Positive for arthralgias and joint swelling.  Skin: Negative for wound.  Neurological: Negative for weakness and numbness.    Physical Exam Updated Vital Signs BP 110/82 (BP Location: Left Arm)   Pulse (!) 111   Temp 98 F (36.7 C) (Oral)   Resp 18   Ht 6' (1.829 m)   Wt 79.4 kg   SpO2 100%   BMI 23.73 kg/m   Physical Exam Vitals and nursing note reviewed.  Constitutional:      General: He is not in acute distress.    Appearance: He is well-developed.  HENT:     Head: Normocephalic and atraumatic.  Eyes:     General: No scleral icterus.       Right eye: No discharge.        Left eye: No discharge.     Conjunctiva/sclera: Conjunctivae normal.     Pupils: Pupils are equal, round, and reactive to light.  Cardiovascular:     Rate and Rhythm: Normal rate.  Pulmonary:     Effort: Pulmonary effort is normal.  No respiratory distress.  Abdominal:     General: There is no distension.  Musculoskeletal:     Cervical back: Normal range of motion.     Comments: Right elbow: Mild diffuse swelling of the elbow. Limited ROM. Tenderness over the lateral aspect of the elbow. No olecranon or medial tenderness. 2+ radial pulse. Normal grip strength.  Skin:    General: Skin is warm and dry.     Coloration: Skin is not pale.  Neurological:     Mental Status: He is alert and oriented to person, place, and time.  Psychiatric:        Behavior: Behavior normal.     ED Results / Procedures / Treatments   Labs (all labs ordered are listed, but only abnormal results are displayed) Labs Reviewed - No data to display  EKG None  Radiology DG Elbow Complete Right  Result Date: 07/18/2019 CLINICAL DATA:  24 year old  male with right elbow trauma and pain. EXAM: RIGHT ELBOW - COMPLETE 3+ VIEW COMPARISON:  None. FINDINGS: Faint curvilinear density adjacent to the capitellum concerning for displaced cortical fracture fragment. There is apparent area of defect involving the inferior capitellum. There is no dislocation. The bones are well mineralized. No arthritic changes. There is a large joint effusion with elevation of the anterior and posterior fat pads. The soft tissues are unremarkable. IMPRESSION: Large joint effusion with findings most concerning for a capitellar fracture. CT may provide better evaluation if clinically indicated. Electronically Signed   By: Anner Crete M.D.   On: 07/18/2019 22:31    Procedures Procedures (including critical care time)  Medications Ordered in ED Medications - No data to display  ED Course  I have reviewed the triage vital signs and the nursing notes.  Pertinent labs & imaging results that were available during my care of the patient were reviewed by me and considered in my medical decision making (see chart for details).  24 year old male presents with Jauca injury and right elbow pain.  X-ray shows joint effusion with capitellar fracture.  Patient was placed in a posterior long-arm splint and sling.  Was given medication for pain control and advised to follow-up with orthopedics.  MDM Rules/Calculators/A&P                      Final Clinical Impression(s) / ED Diagnoses Final diagnoses:  Closed fracture of right elbow, initial encounter    Rx / DC Orders ED Discharge Orders    None       Recardo Evangelist, PA-C 07/19/19 [place], April, MD 07/19/19 [id number]

## 2019-07-19 NOTE — Telephone Encounter (Signed)
1.Medication Requested:amphetamine-dextroamphetamine (ADDERALL) 10 MG tablet  2. Pharmacy (Name, Street, City):[facility], [place] [address]  3. On Med List: Yes   4. Last Visit with PCP: 2.2.2021  5. Next visit date with PCP: 8.2.2021    Agent: Please be advised that RX refills may take up to 3 business days. We ask that you follow-up with your pharmacy.

## 2019-07-19 NOTE — Discharge Instructions (Addendum)
Please rest and keep the right arm in a sling Take Ibuprofen or Tylenol for mild-moderate pain Take Norco for severe pain Please make an appointment with Orthopedics to follow up

## 2019-07-19 NOTE — Progress Notes (Signed)
Orthopedic Tech Progress Note Patient Details:  Micheal Lewis [birth year] [id number]  Ortho Devices Type of Ortho Device: Ace wrap, Shoulder immobilizer, Long arm splint Ortho Device/Splint Location: Charging for ER supplies       [place] Pink 07/19/2019, 9:41 AM

## 2019-07-19 NOTE — Telephone Encounter (Signed)
New message    The patient went to the emergency room yesterday    Need an MD note to excuse from work from 4/27 to 4/28.    Will come by the office to pick up   Please advise

## 2019-07-19 NOTE — Telephone Encounter (Signed)
Yes, ok 

## 2019-07-19 NOTE — Telephone Encounter (Signed)
Ok to write? 

## 2019-07-19 NOTE — Telephone Encounter (Signed)
Letter written

## 2019-07-20 MED ORDER — AMPHETAMINE-DEXTROAMPHETAMINE 10 MG PO TABS: 10.0000 mg | ORAL_TABLET | Freq: Every day | ORAL | 0 refills | Status: DC

## 2019-07-21 ENCOUNTER — Other Ambulatory Visit: Payer: Self-pay

## 2019-07-21 ENCOUNTER — Ambulatory Visit (INDEPENDENT_AMBULATORY_CARE_PROVIDER_SITE_OTHER): Payer: BC Managed Care – PPO | Admitting: Orthopaedic Surgery

## 2019-07-21 ENCOUNTER — Encounter: Payer: Self-pay | Admitting: Orthopaedic Surgery

## 2019-07-21 VITALS — Ht 72.0 in | Wt 175.0 lb

## 2019-07-21 DIAGNOSIS — S42451A Displaced fracture of lateral condyle of right humerus, initial encounter for closed fracture: Secondary | ICD-10-CM

## 2019-07-21 NOTE — Progress Notes (Signed)
Office Visit Note   Patient: Micheal Lewis           Date of Birth: [birth year]           MRN: [id number] Visit Date: 07/21/2019              Requested by: Binnie Rail, MD [place],  [place] [id number] PCP: Binnie Rail, MD   Assessment & Plan: Visit Diagnoses:  1. Closed fracture of capitellum of distal humerus, right, initial encounter     Plan: Impression is suspected right capitellar fracture.  We will need a CT scan with 3D recon for better fracture characterization.  Work note provided today for out of work for 2 weeks.  I will have my office reach out to him once his CT scan has been completed.  Follow-Up Instructions: Return if symptoms worsen or fail to improve.   Orders:  Orders Placed This Encounter  Procedures  . CT ELBOW RIGHT WO CONTRAST   No orders of the defined types were placed in this encounter.     Procedures: No procedures performed   Clinical Data: No additional findings.   Subjective: Chief Complaint  Patient presents with  . Right Elbow - Pain    Micheal Lewis is a 24 year old healthy male right-hand-dominant comes in for evaluation from the ER for a right capitellar fracture.  He had a mechanical fall while rollerskating 3 days ago.  He has no pain at rest but he is unable to fully extend or flex the elbow due to discomfort.  He is not taking any hydrocodone.  Denies any numbness and tingling.  He works as a Editor, commissioning at [employer].   Review of Systems  Constitutional: Negative.   All other systems reviewed and are negative.    Objective: Vital Signs: Ht 6' (1.829 m)   Wt 175 lb (79.4 kg)   BMI 23.73 kg/m   Physical Exam Vitals and nursing note reviewed.  Constitutional:      Appearance: He is well-developed.  HENT:     Head: Normocephalic and atraumatic.  Eyes:     Pupils: Pupils are equal, round, and reactive to light.  Pulmonary:     Effort: Pulmonary effort is normal.  Abdominal:     Palpations:  Abdomen is soft.  Musculoskeletal:        General: Normal range of motion.     Cervical back: Neck supple.  Skin:    General: Skin is warm.  Neurological:     Mental Status: He is alert and oriented to person, place, and time.  Psychiatric:        Behavior: Behavior normal.        Thought Content: Thought content normal.        Judgment: Judgment normal.     Ortho Exam Right elbow shows mild swelling.  Neurovascular intact distally.  He lacks full range of motion secondary to discomfort. Specialty Comments:  No specialty comments available.  Imaging: No results found.   PMFS History: Patient Active Problem List   Diagnosis Date Noted  . Closed fracture of capitellum of distal humerus, right, initial encounter 07/21/2019  . Rectal bleeding 04/27/2019  . Anxiety 09/09/2018  . Depression 07/30/2015  . ADD (attention deficit disorder) 01/16/2015   Past Medical History:  Diagnosis Date  . ADHD (attention deficit hyperactivity disorder)   . Anxiety   . Depression     Family History  Problem Relation Age of Onset  .  Hyperlipidemia Father   . Hypertension Father   . ADD / ADHD Father   . Post-traumatic stress disorder Mother   . Depression Sister   . Anxiety disorder Sister   . Depression Maternal Grandmother   . Alcohol abuse Paternal Uncle   . Depression Maternal Grandfather     No past surgical history on file. Social History   Occupational History  . Not on file  Tobacco Use  . Smoking status: Never Smoker  . Smokeless tobacco: Never Used  Substance and Sexual Activity  . Alcohol use: Yes    Comment: occasional  . Drug use: Yes    Types: Marijuana    Comment: occasional  . Sexual activity: Yes    Birth control/protection: None

## 2019-07-22 ENCOUNTER — Other Ambulatory Visit: Payer: Self-pay | Admitting: Orthopaedic Surgery

## 2019-07-22 DIAGNOSIS — S42451A Displaced fracture of lateral condyle of right humerus, initial encounter for closed fracture: Secondary | ICD-10-CM

## 2019-07-23 ENCOUNTER — Ambulatory Visit
Admission: RE | Admit: 2019-07-23 | Discharge: 2019-07-23 | Disposition: A | Discharge status: Home / Self Care | Payer: BC Managed Care – PPO | Source: Ambulatory Visit | Attending: Orthopaedic Surgery | Admitting: Orthopaedic Surgery

## 2019-07-23 ENCOUNTER — Other Ambulatory Visit: Payer: Self-pay | Admitting: Orthopaedic Surgery

## 2019-07-23 ENCOUNTER — Telehealth: Payer: Self-pay | Admitting: *Deleted

## 2019-07-23 DIAGNOSIS — S42451A Displaced fracture of lateral condyle of right humerus, initial encounter for closed fracture: Secondary | ICD-10-CM

## 2019-07-23 NOTE — Telephone Encounter (Signed)
Pt called back and is aware can go as walkin to [facility] [address] location.

## 2019-07-23 NOTE — Telephone Encounter (Signed)
I called and sw Crystal with [facility] and she stated as long as insurance is authorized pt can come in a walk in at [address], I called pt left vm to return my call

## 2019-07-24 NOTE — Progress Notes (Signed)
Needs f/u appt 

## 2019-07-28 ENCOUNTER — Encounter: Payer: Self-pay | Admitting: Orthopaedic Surgery

## 2019-07-28 ENCOUNTER — Other Ambulatory Visit: Payer: Self-pay

## 2019-07-28 ENCOUNTER — Ambulatory Visit (INDEPENDENT_AMBULATORY_CARE_PROVIDER_SITE_OTHER): Payer: BC Managed Care – PPO | Admitting: Orthopaedic Surgery

## 2019-07-28 DIAGNOSIS — S42451A Displaced fracture of lateral condyle of right humerus, initial encounter for closed fracture: Secondary | ICD-10-CM | POA: Diagnosis not present

## 2019-07-28 NOTE — Progress Notes (Signed)
   Office Visit Note   Patient: Micheal Lewis           Date of Birth: Aug 13, 1995           MRN: [id number] Visit Date: 07/28/2019              Requested by: Binnie Rail, MD [place],  [place] [id number] PCP: Binnie Rail, MD   Assessment & Plan: Visit Diagnoses:  1. Closed fracture of capitellum of distal humerus, right, initial encounter     Plan: CT scan elbow reviewed in detail with the patient and he has a lateral capitellar dye punch fracture.  Given the fracture morphology I feel that this can be treated without operative intervention.  I think that small intra-articular pieces should resorb and he is range of motion has significantly improved.  His symptoms have also significantly improved.  We will need to keep him nonweightbearing at this point.  For the next 3 weeks he needs to work on range of motion on his own at home.  I anticipate that we will send to physical therapy after the next visit.  Continue to wear the sling in public.  Limit lifting to less than a couple pounds.  Questions encouraged and answered.  Follow-Up Instructions: Return in about 3 weeks (around 08/18/2019).   Orders:  No orders of the defined types were placed in this encounter.  No orders of the defined types were placed in this encounter.     Procedures: No procedures performed   Clinical Data: No additional findings.   Subjective: Chief Complaint  Patient presents with  . Right Elbow - Pain    Cainan returns today for CT review of the right elbow.  No changes in medical history.  No new numbness or tingling.   Review of Systems   Objective: Vital Signs: There were no vitals taken for this visit.  Physical Exam  Ortho Exam Right elbow shows full supination pronation.  He has good elbow flexion but lacks about 20 degrees of full extension. Specialty Comments:  No specialty comments available.  Imaging: No results found.   PMFS History: Patient Active  Problem List   Diagnosis Date Noted  . Closed fracture of capitellum of distal humerus, right, initial encounter 07/21/2019  . Rectal bleeding 04/27/2019  . Anxiety 09/09/2018  . Depression 07/30/2015  . ADD (attention deficit disorder) 01/16/2015   Past Medical History:  Diagnosis Date  . ADHD (attention deficit hyperactivity disorder)   . Anxiety   . Depression     Family History  Problem Relation Age of Onset  . Hyperlipidemia Father   . Hypertension Father   . ADD / ADHD Father   . Post-traumatic stress disorder Mother   . Depression Sister   . Anxiety disorder Sister   . Depression Maternal Grandmother   . Alcohol abuse Paternal Uncle   . Depression Maternal Grandfather     History reviewed. No pertinent surgical history. Social History   Occupational History  . Not on file  Tobacco Use  . Smoking status: Never Smoker  . Smokeless tobacco: Never Used  Substance and Sexual Activity  . Alcohol use: Yes    Comment: occasional  . Drug use: Yes    Types: Marijuana    Comment: occasional  . Sexual activity: Yes    Birth control/protection: None

## 2019-08-03 ENCOUNTER — Telehealth: Payer: Self-pay | Admitting: Orthopaedic Surgery

## 2019-08-03 NOTE — Telephone Encounter (Signed)
No limit more than 2 lbs.

## 2019-08-03 NOTE — Telephone Encounter (Signed)
Patient stopped by.   He wanted to obtain a note returning him to work tomorrow. He also needs it to detail any restrictions, if any.   Call back: [phone]

## 2019-08-04 ENCOUNTER — Telehealth: Payer: Self-pay | Admitting: Orthopaedic Surgery

## 2019-08-04 NOTE — Telephone Encounter (Signed)
That is fine 

## 2019-08-04 NOTE — Telephone Encounter (Signed)
Patient called. He can not return to work with those restrictions he has. Would like an updated work noted for date to return to work 08/19/2019. His call back number is [phone]

## 2019-08-04 NOTE — Telephone Encounter (Signed)
Patient aware.

## 2019-08-04 NOTE — Telephone Encounter (Signed)
See message.

## 2019-08-04 NOTE — Telephone Encounter (Signed)
Note ready for pick up

## 2019-08-18 ENCOUNTER — Ambulatory Visit (INDEPENDENT_AMBULATORY_CARE_PROVIDER_SITE_OTHER): Payer: BC Managed Care – PPO | Admitting: Orthopaedic Surgery

## 2019-08-18 ENCOUNTER — Other Ambulatory Visit: Payer: Self-pay

## 2019-08-18 ENCOUNTER — Telehealth: Payer: Self-pay

## 2019-08-18 ENCOUNTER — Encounter: Payer: Self-pay | Admitting: Orthopaedic Surgery

## 2019-08-18 DIAGNOSIS — S42451A Displaced fracture of lateral condyle of right humerus, initial encounter for closed fracture: Secondary | ICD-10-CM

## 2019-08-18 NOTE — Progress Notes (Signed)
   Office Visit Note   Patient: Micheal Lewis           Date of Birth: [birth year]           MRN: [id number] Visit Date: 08/18/2019              Requested by: Binnie Rail, MD [place],  [place] [address] PCP: Binnie Rail, MD   Assessment & Plan: Visit Diagnoses:  1. Closed fracture of capitellum of distal humerus, right, initial encounter     Plan: At this point I feel comfortable with him going back to work without restrictions.  He is to avoid any heavy lifting for another month and then progress his activity as tolerated.  Questions encouraged and answered.  Follow-up as needed.  Follow-Up Instructions: Return if symptoms worsen or fail to improve.   Orders:  No orders of the defined types were placed in this encounter.  No orders of the defined types were placed in this encounter.     Procedures: No procedures performed   Clinical Data: No additional findings.   Subjective: Chief Complaint  Patient presents with  . Right Elbow - Pain    Micheal Lewis returns today for follow-up of his capitellar fracture.  He is doing well.  He denies any pain but mainly just a soreness and discomfort with certain activities.  Not taking anything for the discomfort.  He would like to return back to work tomorrow.   Review of Systems   Objective: Vital Signs: There were no vitals taken for this visit.  Physical Exam  Ortho Exam Right elbow shows full range of motion without pain.  He has no tenderness to palpation.  Good strength. Specialty Comments:  No specialty comments available.  Imaging: No results found.   PMFS History: Patient Active Problem List   Diagnosis Date Noted  . Closed fracture of capitellum of distal humerus, right, initial encounter 07/21/2019  . Rectal bleeding 04/27/2019  . Anxiety 09/09/2018  . Depression 07/30/2015  . ADD (attention deficit disorder) 01/16/2015   Past Medical History:  Diagnosis Date  . ADHD (attention  deficit hyperactivity disorder)   . Anxiety   . Depression     Family History  Problem Relation Age of Onset  . Hyperlipidemia Father   . Hypertension Father   . ADD / ADHD Father   . Post-traumatic stress disorder Mother   . Depression Sister   . Anxiety disorder Sister   . Depression Maternal Grandmother   . Alcohol abuse Paternal Uncle   . Depression Maternal Grandfather     History reviewed. No pertinent surgical history. Social History   Occupational History  . Not on file  Tobacco Use  . Smoking status: Never Smoker  . Smokeless tobacco: Never Used  Substance and Sexual Activity  . Alcohol use: Yes    Comment: occasional  . Drug use: Yes    Types: Marijuana    Comment: occasional  . Sexual activity: Yes    Birth control/protection: None

## 2019-08-18 NOTE — Telephone Encounter (Signed)
1.Medication Requested:amphetamine-dextroamphetamine (ADDERALL) 10 MG tablet  2. Pharmacy (Name, Street, City):[facility], [place] [address]  3. On Med List: Yes  4. Last Visit with PCP: 2.2.2021   5. Next visit date with PCP: 8.2.21    Agent: Please be advised that RX refills may take up to 3 business days. We ask that you follow-up with your pharmacy.

## 2019-08-18 NOTE — Telephone Encounter (Signed)
Last OV 04/27/19 Next OV 10/25/19 Last RF 06/16/19

## 2019-08-19 MED ORDER — AMPHETAMINE-DEXTROAMPHETAMINE 10 MG PO TABS: 10.0000 mg | ORAL_TABLET | Freq: Every day | ORAL | 0 refills | Status: DC

## 2019-09-17 ENCOUNTER — Encounter: Payer: Self-pay | Admitting: Gastroenterology

## 2019-09-17 ENCOUNTER — Ambulatory Visit (INDEPENDENT_AMBULATORY_CARE_PROVIDER_SITE_OTHER): Payer: BC Managed Care – PPO | Admitting: Gastroenterology

## 2019-09-17 DIAGNOSIS — K6289 Other specified diseases of anus and rectum: Secondary | ICD-10-CM | POA: Diagnosis not present

## 2019-09-17 DIAGNOSIS — K602 Anal fissure, unspecified: Secondary | ICD-10-CM

## 2019-09-17 DIAGNOSIS — K625 Hemorrhage of anus and rectum: Secondary | ICD-10-CM

## 2019-09-17 MED ORDER — AMBULATORY NON FORMULARY MEDICATION: 0 refills | Status: DC

## 2019-09-17 MED ORDER — SUTAB 1479-225-188 MG PO TABS: ORAL_TABLET | ORAL | 0 refills | Status: DC

## 2019-09-17 NOTE — Patient Instructions (Addendum)
If you are age 23 or older, your body mass index should be between 23-30. Your Body mass index is 23.79 kg/m. If this is out of the aforementioned range listed, please consider follow up with your Primary Care Provider.  If you are age 22 or younger, your body mass index should be between 19-25. Your Body mass index is 23.79 kg/m. If this is out of the aformentioned range listed, please consider follow up with your Primary Care Provider.   You have been scheduled for a colonoscopy. Please follow written instructions given to you at your visit today.  Please pick up your prep supplies at the pharmacy within the next 1-3 days. If you use inhalers (even only as needed), please bring them with you on the day of your procedure.   Due to recent changes in healthcare laws, you may see the results of your imaging and laboratory studies on MyChart before your provider has had a chance to review them.  We understand that in some cases there may be results that are confusing or concerning to you. Not all laboratory results come back in the same time frame and the provider may be waiting for multiple results in order to interpret others.  Please give [place] 48 hours in order for your provider to thoroughly review all the results before contacting the office for clarification of your results.   Given your ongoing symptoms I recommend that you:  - Eat 25-30 g of fiber in your diet through fruits, vegetables, and bran fibers - Start diltiazem ointment applied 4 times daily to the rectum - Sitz bath after a bowel movement will help with the pain (epsome salt in a bathtub with warm water, sit in the warm water 10 minutes up to twice a day as needed) - Citrucel 1 tblsp twice daily, titrate to a soft, formed stool - Miralax 1 capful daily if needed to keep stools soft - Limit your time on the toilet. Sit no longer than 5 minutes. Resist straining. - Before wiping after a bowel movement, swipe the paper into a jar of  vaseline to give the paper a slight covering of the vaseline until fully healed. This will help reduce irriation and discomfort to the area.   Tips for colonoscopy:  - Stay well hydrated for 3-4 days prior to the exam. This reduces nausea and dehydration.  - To prevent skin/hemorrhoid irritation - prior to wiping, put A&Dointment or vaseline on the toilet paper. - Keep a towel or pad on the bed.  - Drink  64oz of clear liquids in the morning of prep day (prior to starting the prep) to be sure that there is enough fluid to flush the colon and stay hydrated!!!! This is in addition to the fluids required for preparation. - Use of a flavored hard candy, such as grape Anise Salvo, can counteract some of the flavor of the prep and may prevent some nausea.

## 2019-09-17 NOTE — Progress Notes (Signed)
Anal exam

## 2019-09-17 NOTE — Progress Notes (Signed)
Referring Provider: Binnie Rail, MD Primary Care Physician:  Binnie Rail, MD  Chief complaint:  Persistent rectal bleeding   IMPRESSION:  Persistent anterior anal fissure External and internal hemorrhoids No extra GI manifestations of IBD No known family history of IBD, autoimmune disease, colon cancer or polyps  Some improvement on exam, but persistent symptoms from anal fissure. Plan treatment with diltiazem and colonoscopy to exclude a concurrent process. Encouraged him to continue drinking at least 64 ounces of water daily, following a high fiber diet, and using daily psyllium for stool bulking.     PLAN: Sitz baths two to three times daily for pain relief High fiber diet - increasing both dietary fiber (35 grams daily) and water intake  Use Benefiber or Metamucil daily, add Miralax 17 g daily if needed to keep stools soft Diltiazem 2% compounded with lidocaine 5% ointment applied to the rectum 4 times daily Colonoscopy  The nature of the procedure, as well as the risks, benefits, and alternatives were carefully and thoroughly reviewed with the patient. Ample time for discussion and questions allowed. The patient understood, was satisfied, and agreed to proceed.  Please see the "Patient Instructions" section for addition details about the plan.  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 He has anxiety and depression.  The history is obtained through the patient and review of his electronic health record. Works the second shift at [employer]. Completed the Covid vaccine this spring.   Seen in consultation 05/25/19 for a >1 year history of intermittent rectal bleeding with every bowel movement.  Sees blood on the tissue or on the stool. Associated rectal pain. Several soft bowel movements daily. Baseline 3-4 loose stools daily. Frequent straining.   Nitroglycerin ointment x 4-6 weeks resulted  in headaches. Bleeding persists despite therapy. He continues to have bleeding with persistent pain with defecation.   Drinking a lot of water. Did not remember to take his Metamucil in the morning, although he doesn't mind taking it. Working the 3rd shift.   No known family history of IBD or autoimmune disease. No known family history of colon cancer or polyps. No family history of uterine/endometrial cancer, pancreatic cancer or gastric/stomach cancer.  Labs 08/07/18: hgb 16.5, MCV 87.2, RDW 12.6, platelets 272  Past Medical History:  Diagnosis Date  . ADHD (attention deficit hyperactivity disorder)   . Anxiety   . Depression     History reviewed. No pertinent surgical history.  Current Outpatient Medications  Medication Sig Dispense Refill  . AMBULATORY NON FORMULARY MEDICATION Medication Name: Nitroglycerin Ointment 0.125 apply rectally mg three times a day for 6-8 weeks. (Patient taking differently: as needed. Medication Name: Nitroglycerin Ointment 0.125 apply rectally mg three times a day for 6-8 weeks.) 30 g 1  . amphetamine-dextroamphetamine (ADDERALL) 10 MG tablet Take 1 tablet (10 mg total) by mouth daily with breakfast. 30 tablet 0  . FLUoxetine (PROZAC) 20 MG capsule TAKE 1 CAPSULE BY MOUTH EVERY DAY 90 capsule 2  . Psyllium (METAMUCIL PO) Take by mouth as needed. (Patient not taking: Reported on 09/17/2019)     No current facility-administered medications for this visit.    Allergies as of 09/17/2019  . (No Known Allergies)    Family History  Problem Relation Age of Onset  . Hyperlipidemia Father   . Hypertension Father   . ADD / ADHD Father   . Post-traumatic stress disorder Mother   .  Depression Sister   . Anxiety disorder Sister   . Depression Maternal Grandmother   . Alcohol abuse Paternal Uncle   . Depression Maternal Grandfather     Social History   Socioeconomic History  . Marital status: Married    Spouse name: Not on file  . Number of children: 0    . Years of education: Not on file  . Highest education level: High school graduate  Occupational History  . Not on file  Tobacco Use  . Smoking status: Never Smoker  . Smokeless tobacco: Never Used  Vaping Use  . Vaping Use: Never used  Substance and Sexual Activity  . Alcohol use: Yes    Comment: occasional  . Drug use: Yes    Types: Marijuana    Comment: occasional  . Sexual activity: Yes    Birth control/protection: None  Other Topics Concern  . Not on file  Social History Narrative   Exercises - run, lifts      Part time school, works at [employer] Strain:   . Difficulty of Paying Living Expenses:   Food Insecurity:   . Worried About Charity fundraiser in the Last Year:   . Arboriculturist in the Last Year:   Transportation Needs:   . Film/video editor (Medical):   Marland Kitchen Lack of Transportation (Non-Medical):   Physical Activity:   . Days of Exercise per Week:   . Minutes of Exercise per Session:   Stress:   . Feeling of Stress :   Social Connections:   . Frequency of Communication with Friends and Family:   . Frequency of Social Gatherings with Friends and Family:   . Attends Religious Services:   . Active Member of Clubs or Organizations:   . Attends Archivist Meetings:   Marland Kitchen Marital Status:   Intimate Partner Violence:   . Fear of Current or Ex-Partner:   . Emotionally Abused:   Marland Kitchen Physically Abused:   . Sexually Abused:      Physical Exam: General:   Alert,  well-nourished, pleasant and cooperative in NAD Head:  Normocephalic and atraumatic. Eyes:  Sclera clear, no icterus.   Conjunctiva pink. Abdomen:  Soft, nontender, nondistended, normal bowel sounds, no rebound or guarding. No hepatosplenomegaly.   Rectal:   No chemical dermatitis. Non-bleeding anterior anal fissure has improved but is still present. See attached photo. Non-thrombosed, non-tender external hemorrhoids present.  Palpable internal hemorrhoids are nontender.  No fistula. No prolapsing hemorrhoids. No rectal prolapse. Normal anocutaneous reflex. No stool in the rectal vault. No mass or fecal impaction. Normal anal resting tone.  Chaperone: Brooke Extremities:  No clubbing or edema. Neurologic:  Alert and  oriented x4;  grossly nonfocal Skin:  No rash or bruise Psych:  Alert and cooperative. Normal mood and affect.    Annaleise Burger L. Tarri Glenn, MD, MPH 09/17/2019, 9:01 AM

## 2019-10-11 ENCOUNTER — Telehealth: Payer: Self-pay | Admitting: Internal Medicine

## 2019-10-11 NOTE — Telephone Encounter (Signed)
    1.Medication Requested:amphetamine-dextroamphetamine (ADDERALL) 10 MG tablet  2. Pharmacy (Name, Street, City):[facility] [address]  3. On Med List: yes  4. Last Visit with PCP: 04/27/19  5. Next visit date with PCP: 10/25/19   Agent: Please be advised that RX refills may take up to 3 business days. We ask that you follow-up with your pharmacy.

## 2019-10-14 ENCOUNTER — Other Ambulatory Visit: Payer: Self-pay | Admitting: Internal Medicine

## 2019-10-14 DIAGNOSIS — F988 Other specified behavioral and emotional disorders with onset usually occurring in childhood and adolescence: Secondary | ICD-10-CM

## 2019-10-14 MED ORDER — AMPHETAMINE-DEXTROAMPHETAMINE 10 MG PO TABS: 10.0000 mg | ORAL_TABLET | Freq: Every day | ORAL | 0 refills | Status: DC

## 2019-10-18 ENCOUNTER — Encounter: Payer: Self-pay | Admitting: Gastroenterology

## 2019-10-18 ENCOUNTER — Ambulatory Visit (AMBULATORY_SURGERY_CENTER): Payer: BC Managed Care – PPO | Admitting: Gastroenterology

## 2019-10-18 ENCOUNTER — Other Ambulatory Visit: Payer: Self-pay

## 2019-10-18 VITALS — BP 99/62 | HR 54 | Temp 97.5°F | Resp 16 | Ht 72.0 in | Wt 175.0 lb

## 2019-10-18 DIAGNOSIS — K635 Polyp of colon: Secondary | ICD-10-CM | POA: Diagnosis not present

## 2019-10-18 DIAGNOSIS — K625 Hemorrhage of anus and rectum: Secondary | ICD-10-CM | POA: Diagnosis not present

## 2019-10-18 DIAGNOSIS — K648 Other hemorrhoids: Secondary | ICD-10-CM | POA: Diagnosis not present

## 2019-10-18 DIAGNOSIS — K602 Anal fissure, unspecified: Secondary | ICD-10-CM

## 2019-10-18 DIAGNOSIS — D125 Benign neoplasm of sigmoid colon: Secondary | ICD-10-CM

## 2019-10-18 MED ORDER — AMBULATORY NON FORMULARY MEDICATION: 0 refills | Status: AC

## 2019-10-18 MED ORDER — AMBULATORY NON FORMULARY MEDICATION: 0 refills | Status: DC

## 2019-10-18 MED ORDER — SODIUM CHLORIDE 0.9 % IV SOLN
500.0000 mL | INTRAVENOUS | Status: DC
Start: 2019-10-18 — End: 2019-10-18

## 2019-10-18 NOTE — Progress Notes (Signed)
Called to room to assist during endoscopic procedure.  Patient ID and intended procedure confirmed with present staff. Received instructions for my participation in the procedure from the performing physician.  

## 2019-10-18 NOTE — Patient Instructions (Signed)
Thank you for allowing [place] to care for you today!  Await pathology results of polyp removed.  Will make recommendations, if any, at that time.  Recommend using Benefiber or Metamucil daily.  Also add Miralax 17g daily, if needed, to keep stools soft to allow fissure to heal.  Continue Diltiazem 2% compound with Lidocaine applied to rectum 4 times per day.  May do Sitz bath as needed for pain relief, at least 6 weeks.  Referral to Dr Dema Severin for an appointment in 6-8 weeks if the fissure has not completely healed.      YOU HAD AN ENDOSCOPIC PROCEDURE TODAY AT [facility]:   Refer to the procedure report that was given to you for any specific questions about what was found during the examination.  If the procedure report does not answer your questions, please call your gastroenterologist to clarify.  If you requested that your care partner not be given the details of your procedure findings, then the procedure report has been included in a sealed envelope for you to review at your convenience later.  YOU SHOULD EXPECT: Some feelings of bloating in the abdomen. Passage of more gas than usual.  Walking can help get rid of the air that was put into your GI tract during the procedure and reduce the bloating. If you had a lower endoscopy (such as a colonoscopy or flexible sigmoidoscopy) you may notice spotting of blood in your stool or on the toilet paper. If you underwent a bowel prep for your procedure, you may not have a normal bowel movement for a few days.  Please Note:  You might notice some irritation and congestion in your nose or some drainage.  This is from the oxygen used during your procedure.  There is no need for concern and it should clear up in a day or so.  SYMPTOMS TO REPORT IMMEDIATELY:   Following lower endoscopy (colonoscopy or flexible sigmoidoscopy):  Excessive amounts of blood in the stool  Significant tenderness or worsening of abdominal pains  Swelling of  the abdomen that is new, acute  Fever of 100F or higher   For urgent or emergent issues, a gastroenterologist can be reached at any hour by calling [phone]. Do not use MyChart messaging for urgent concerns.    DIET:  We do recommend a small meal at first, but then you may proceed to your regular diet.  Drink plenty of fluids but you should avoid alcoholic beverages for 24 hours.  ACTIVITY:  You should plan to take it easy for the rest of today and you should NOT DRIVE or use heavy machinery until tomorrow (because of the sedation medicines used during the test).    FOLLOW UP: Our staff will call the number listed on your records 48-72 hours following your procedure to check on you and address any questions or concerns that you may have regarding the information given to you following your procedure. If we do not reach you, we will leave a message.  We will attempt to reach you two times.  During this call, we will ask if you have developed any symptoms of COVID 19. If you develop any symptoms (ie: fever, flu-like symptoms, shortness of breath, cough etc.) before then, please call [phone].  If you test positive for Covid 19 in the 2 weeks post procedure, please call and report this information to [place].    If any biopsies were taken you will be contacted by phone or by letter  within the next 1-3 weeks.  Please call us at [phone] if you have not heard about the biopsies in 3 weeks.    SIGNATURES/CONFIDENTIALITY: You and/or your care partner have signed paperwork which will be entered into your electronic medical record.  These signatures attest to the fact that that the information above on your After Visit Summary has been reviewed and is understood.  Full responsibility of the confidentiality of this discharge information lies with you and/or your care-partner.

## 2019-10-18 NOTE — Progress Notes (Signed)
Vs CW ° °

## 2019-10-18 NOTE — Op Note (Signed)
[facility] Patient Name: Micheal Lewis Procedure Date: 10/18/2019 9:58 AM MRN: [id number] Endoscopist: Thornton Park MD, MD Age: 24 Referring MD:  Date of Birth: Jul 26, 1995 Gender: Male Account #: [id number] Procedure:                Colonoscopy Indications:              Rectal bleeding Medicines:                Monitored Anesthesia Care Procedure:                Pre-Anesthesia Assessment:                           - Prior to the procedure, a History and Physical                            was performed, and patient medications and                            allergies were reviewed. The patient's tolerance of                            previous anesthesia was also reviewed. The risks                            and benefits of the procedure and the sedation                            options and risks were discussed with the patient.                            All questions were answered, and informed consent                            was obtained. Prior Anticoagulants: The patient has                            taken no previous anticoagulant or antiplatelet                            agents. ASA Grade Assessment: I - A normal, healthy                            patient. After reviewing the risks and benefits,                            the patient was deemed in satisfactory condition to                            undergo the procedure.                           After obtaining informed consent, the colonoscope  was passed under direct vision. Throughout the                            procedure, the patient's blood pressure, pulse, and                            oxygen saturations were monitored continuously. The                            Colonoscope was introduced through the anus and                            advanced to the 3 cm into the ileum. The                            colonoscopy was performed without difficulty. The                             patient tolerated the procedure well. The quality                            of the bowel preparation was good. The terminal                            ileum, ileocecal valve, appendiceal orifice, and                            rectum were photographed. Scope In: 10:11:35 AM Scope Out: 10:27:27 AM Scope Withdrawal Time: 0 hours 11 minutes 52 seconds  Total Procedure Duration: 0 hours 15 minutes 52 seconds  Findings:                 An anterior anal fissure was found on perianal exam.                           Non-bleeding external and internal hemorrhoids were                            found. The hemorrhoids were small.                           A 1 mm polyp was found in the sigmoid colon. The                            polyp was flat. The polyp was removed with a cold                            snare. Resection and retrieval were complete.                            Estimated blood loss was minimal.                           The exam was otherwise without abnormality on  direct and retroflexion views. Complications:            No immediate complications. Estimated blood loss:                            Minimal. Estimated Blood Loss:     Estimated blood loss was minimal. Impression:               - Anal fissure found on perianal exam.                           - Non-bleeding external and internal hemorrhoids.                           - One 1 mm polyp in the sigmoid colon, removed with                            a cold snare. Resected and retrieved.                           - The examination was otherwise normal on direct                            and retroflexion views. Recommendation:           - Patient has a contact number available for                            emergencies. The signs and symptoms of potential                            delayed complications were discussed with the                            patient. Return to normal  activities tomorrow.                            Written discharge instructions were provided to the                            patient.                           - Continue to use Sitz baths two to three times                            daily for pain relief                           - High fiber diet - increasing both dietary fiber                            (35 grams daily) and water intake                           - Use Benefiber or Metamucil daily, add  Miralax 17                            g daily if needed to keep stools soft                           - Continue Diltiazem 2% compounded with lidocaine                            5% ointment applied to the rectum 4 times dailySitz                            baths two to three times daily for pain relief for                            at least 6 weeks                           - Referral to Dr. Nadeen Landau for an                            appointment in 6-8 weeks if the fissure has not                            completely healed.                           - Continue present medications.                           - Await pathology results.                           - Repeat colonoscopy date to be determined after                            pending pathology results are reviewed for                            surveillance.                           - Emerging evidence supports eating a diet of                            fruits, vegetables, grains, calcium, and yogurt                            while reducing red meat and alcohol may reduce the                            risk of colon cancer. Thornton Park MD, MD 10/18/2019 10:40:05 AM This report has been signed electronically.

## 2019-10-18 NOTE — Addendum Note (Signed)
Addended by: Christell Constant F on: 10/18/2019 01:44 PM   Modules accepted: Orders

## 2019-10-18 NOTE — Progress Notes (Signed)
pt tolerated well. VSS. awake and to recovery. Report given to RN.  

## 2019-10-19 ENCOUNTER — Telehealth: Payer: Self-pay

## 2019-10-19 NOTE — Telephone Encounter (Signed)
Patient returned to office for work note for procedure done on October 18, 2019.  Work note completed and given to patient today.

## 2019-10-20 ENCOUNTER — Telehealth: Payer: Self-pay

## 2019-10-20 NOTE — Telephone Encounter (Signed)
Left message on follow up call. 

## 2019-10-20 NOTE — Telephone Encounter (Signed)
Attempted to reach pt. With follow-up call following endoscopic procedure 10/18/2019.  LM on pt. Voice mail.  Will try to reach pt. Again later today.

## 2019-10-21 ENCOUNTER — Encounter: Payer: Self-pay | Admitting: Gastroenterology

## 2019-10-24 NOTE — Patient Instructions (Addendum)
Blood work was ordered.    All other Health Maintenance issues reviewed.   All recommended immunizations and age-appropriate screenings are up-to-date or discussed.  No immunization administered today.   Medications reviewed and updated.  Changes include :   Start pepcid daily - work on food changes so you do not need the medication daily longterm.   Your prescription(s) have been submitted to your pharmacy. Please take as directed and contact our office if you believe you are having problem(s) with the medication(s).    Please followup in 6 months    Health Maintenance, Male Adopting a healthy lifestyle and getting preventive care are important in promoting health and wellness. Ask your health care provider about:  The right schedule for you to have regular tests and exams.  Things you can do on your own to prevent diseases and keep yourself healthy. What should I know about diet, weight, and exercise? Eat a healthy diet   Eat a diet that includes plenty of vegetables, fruits, low-fat dairy products, and lean protein.  Do not eat a lot of foods that are high in solid fats, added sugars, or sodium. Maintain a healthy weight Body mass index (BMI) is a measurement that can be used to identify possible weight problems. It estimates body fat based on height and weight. Your health care provider can help determine your BMI and help you achieve or maintain a healthy weight. Get regular exercise Get regular exercise. This is one of the most important things you can do for your health. Most adults should:  Exercise for at least 150 minutes each week. The exercise should increase your heart rate and make you sweat (moderate-intensity exercise).  Do strengthening exercises at least twice a week. This is in addition to the moderate-intensity exercise.  Spend less time sitting. Even light physical activity can be beneficial. Watch cholesterol and blood lipids Have your blood tested for  lipids and cholesterol at 23 years of age, then have this test every 5 years. You may need to have your cholesterol levels checked more often if:  Your lipid or cholesterol levels are high.  You are older than 22 years of age.  You are at high risk for heart disease. What should I know about cancer screening? Many types of cancers can be detected early and may often be prevented. Depending on your health history and family history, you may need to have cancer screening at various ages. This may include screening for:  Colorectal cancer.  Prostate cancer.  Skin cancer.  Lung cancer. What should I know about heart disease, diabetes, and high blood pressure? Blood pressure and heart disease  High blood pressure causes heart disease and increases the risk of stroke. This is more likely to develop in people who have high blood pressure readings, are of African descent, or are overweight.  Talk with your health care provider about your target blood pressure readings.  Have your blood pressure checked: ? Every 3-5 years if you are 51-32 years of age. ? Every year if you are 61 years old or older.  If you are between the ages of 46 and 41 and are a current or former smoker, ask your health care provider if you should have a one-time screening for abdominal aortic aneurysm (AAA). Diabetes Have regular diabetes screenings. This checks your fasting blood sugar level. Have the screening done:  Once every three years after age 44 if you are at a normal weight and have a low risk  for diabetes.  More often and at a younger age if you are overweight or have a high risk for diabetes. What should I know about preventing infection? Hepatitis B If you have a higher risk for hepatitis B, you should be screened for this virus. Talk with your health care provider to find out if you are at risk for hepatitis B infection. Hepatitis C Blood testing is recommended for:  Everyone born from [birth year] through  1965.  Anyone with known risk factors for hepatitis C. Sexually transmitted infections (STIs)  You should be screened each year for STIs, including gonorrhea and chlamydia, if: ? You are sexually active and are younger than 23 years of age. ? You are older than 23 years of age and your health care provider tells you that you are at risk for this type of infection. ? Your sexual activity has changed since you were last screened, and you are at increased risk for chlamydia or gonorrhea. Ask your health care provider if you are at risk.  Ask your health care provider about whether you are at high risk for HIV. Your health care provider may recommend a prescription medicine to help prevent HIV infection. If you choose to take medicine to prevent HIV, you should first get tested for HIV. You should then be tested every 3 months for as long as you are taking the medicine. Follow these instructions at home: Lifestyle  Do not use any products that contain nicotine or tobacco, such as cigarettes, e-cigarettes, and chewing tobacco. If you need help quitting, ask your health care provider.  Do not use street drugs.  Do not share needles.  Ask your health care provider for help if you need support or information about quitting drugs. Alcohol use  Do not drink alcohol if your health care provider tells you not to drink.  If you drink alcohol: ? Limit how much you have to 0-2 drinks a day. ? Be aware of how much alcohol is in your drink. In the [place] one drink equals one 12 oz bottle of beer (355 mL), one 5 oz glass of wine (148 mL), or one 1 oz glass of hard liquor (44 mL). General instructions  Schedule regular health, dental, and eye exams.  Stay current with your vaccines.  Tell your health care provider if: ? You often feel depressed. ? You have ever been abused or do not feel safe at home. Summary  Adopting a healthy lifestyle and getting preventive care are important in promoting  health and wellness.  Follow your health care provider's instructions about healthy diet, exercising, and getting tested or screened for diseases.  Follow your health care provider's instructions on monitoring your cholesterol and blood pressure. This information is not intended to replace advice given to you by your health care provider. Make sure you discuss any questions you have with your health care provider. Document Revised: 03/04/2018 Document Reviewed: 03/04/2018 Elsevier Patient Education  2020 [employer].

## 2019-10-24 NOTE — Progress Notes (Signed)
Subjective:    Patient ID: Micheal Lewis, male    DOB: Aug 26, 1995, 24 y.o.   MRN: [id number]  HPI He is here for a physical exam.   Since he was here last he had a colonoscopy.  He had a fracture in his right arm.    Medications and allergies reviewed with patient and updated if appropriate.  Patient Active Problem List   Diagnosis Date Noted  . Anal fissure 10/25/2019  . Closed fracture of capitellum of distal humerus, right, initial encounter 07/21/2019  . Rectal bleeding 04/27/2019  . Anxiety 09/09/2018  . Depression 07/30/2015  . ADD (attention deficit disorder) 01/16/2015    Current Outpatient Medications on File Prior to Visit  Medication Sig Dispense Refill  . AMBULATORY NON FORMULARY MEDICATION Diltiazem: 2% with lidocaine 5% ointment Using your index finger, apply a small amount of medication inside the rectum up to your first knuckle/joint 4 times daily 30 g 0  . amphetamine-dextroamphetamine (ADDERALL) 10 MG tablet Take 1 tablet (10 mg total) by mouth daily with breakfast. 30 tablet 0  . FLUoxetine (PROZAC) 20 MG capsule TAKE 1 CAPSULE BY MOUTH EVERY DAY 90 capsule 2  . Psyllium (METAMUCIL PO) Take by mouth as needed.      No current facility-administered medications on file prior to visit.    Past Medical History:  Diagnosis Date  . ADHD (attention deficit hyperactivity disorder)   . Anxiety   . Depression   . Sleep apnea    snores heavily    Past Surgical History:  Procedure Laterality Date  . WISDOM TOOTH EXTRACTION      Social History   Socioeconomic History  . Marital status: Married    Spouse name: Not on file  . Number of children: 0  . Years of education: Not on file  . Highest education level: High school graduate  Occupational History  . Not on file  Tobacco Use  . Smoking status: Never Smoker  . Smokeless tobacco: Never Used  Vaping Use  . Vaping Use: Never used  Substance and Sexual Activity  . Alcohol use: Yes    Comment:  occasional  . Drug use: Yes    Types: Marijuana    Comment: occasional last use march 2021  . Sexual activity: Yes    Birth control/protection: None  Other Topics Concern  . Not on file  Social History Narrative   Exercises - run, lifts      Part time school, works at [employer] Strain:   . Difficulty of Paying Living Expenses:   Food Insecurity:   . Worried About Charity fundraiser in the Last Year:   . Arboriculturist in the Last Year:   Transportation Needs:   . Film/video editor (Medical):   Marland Kitchen Lack of Transportation (Non-Medical):   Physical Activity:   . Days of Exercise per Week:   . Minutes of Exercise per Session:   Stress:   . Feeling of Stress :   Social Connections:   . Frequency of Communication with Friends and Family:   . Frequency of Social Gatherings with Friends and Family:   . Attends Religious Services:   . Active Member of Clubs or Organizations:   . Attends Archivist Meetings:   Marland Kitchen Marital Status:     Family History  Problem Relation Age of Onset  . Hyperlipidemia Father   . Hypertension Father   .  ADD / ADHD Father   . Post-traumatic stress disorder Mother   . Depression Sister   . Anxiety disorder Sister   . Depression Maternal Grandmother   . Alcohol abuse Paternal Uncle   . Depression Maternal Grandfather     Review of Systems  Constitutional: Negative for chills and fever.  Eyes: Negative for visual disturbance.  Respiratory: Negative for cough, shortness of breath and wheezing.   Cardiovascular: Negative for chest pain, palpitations and leg swelling.  Gastrointestinal: Negative for abdominal pain, blood in stool, constipation, diarrhea and nausea.       Gerd 1-2 / day  Genitourinary: Positive for dysuria (occ). Negative for hematuria.  Musculoskeletal: Negative for arthralgias and back pain.  Skin: Negative for rash.  Neurological: Positive for headaches  (occ). Negative for light-headedness.  Psychiatric/Behavioral: Positive for decreased concentration and dysphoric mood. The patient is nervous/anxious.        Objective:   Vitals:   10/25/19 1113  BP: 118/78  Pulse: 67  Temp: 98.2 F (36.8 C)  SpO2: 98%   Filed Weights   10/25/19 1113  Weight: 175 lb (79.4 kg)   Body mass index is 23.73 kg/m.  BP Readings from Last 3 Encounters:  10/25/19 118/78  10/18/19 (!) 99/62  09/17/19 114/66    Wt Readings from Last 3 Encounters:  10/25/19 175 lb (79.4 kg)  10/18/19 175 lb (79.4 kg)  09/17/19 175 lb 6 oz (79.5 kg)     Physical Exam Constitutional: He appears well-developed and well-nourished. No distress.  HENT:  Head: Normocephalic and atraumatic.  Right Ear: External ear normal.  Left Ear: External ear normal.  Mouth/Throat: Oropharynx is clear and moist.  Normal ear canals and TM b/l  Eyes: Conjunctivae and EOM are normal.  Neck: Neck supple. No tracheal deviation present. No thyromegaly present.  No carotid bruit  Cardiovascular: Normal rate, regular rhythm, normal heart sounds and intact distal pulses.   No murmur heard. Pulmonary/Chest: Effort normal and breath sounds normal. No respiratory distress. He has no wheezes. He has no rales.  Abdominal: Soft. He exhibits no distension. There is no tenderness.  Genitourinary: deferred  Musculoskeletal: He exhibits no edema.  Lymphadenopathy:   He has no cervical adenopathy.  Skin: Skin is warm and dry. He is not diaphoretic.  Psychiatric: He has a normal mood and affect. His behavior is normal.         Assessment & Plan:   Physical exam: Screening blood work  ordered Immunizations  Up to date  Exercise   Walking at lot of work, lifts a lot at work [employer]  normal Substance abuse   none  See Problem List for Assessment and Plan of chronic medical problems.   This visit occurred during the SARS-CoV-2 public health emergency.  Safety protocols were in place,  including screening questions prior to the visit, additional usage of staff PPE, and extensive cleaning of exam room while observing appropriate contact time as indicated for disinfecting solutions.

## 2019-10-25 ENCOUNTER — Encounter: Payer: Self-pay | Admitting: Internal Medicine

## 2019-10-25 ENCOUNTER — Ambulatory Visit (INDEPENDENT_AMBULATORY_CARE_PROVIDER_SITE_OTHER): Payer: BC Managed Care – PPO | Admitting: Internal Medicine

## 2019-10-25 ENCOUNTER — Other Ambulatory Visit: Payer: Self-pay

## 2019-10-25 VITALS — BP 118/78 | HR 67 | Temp 98.2°F | Ht 72.0 in | Wt 175.0 lb

## 2019-10-25 DIAGNOSIS — K602 Anal fissure, unspecified: Secondary | ICD-10-CM | POA: Insufficient documentation

## 2019-10-25 DIAGNOSIS — F3289 Other specified depressive episodes: Secondary | ICD-10-CM

## 2019-10-25 DIAGNOSIS — F988 Other specified behavioral and emotional disorders with onset usually occurring in childhood and adolescence: Secondary | ICD-10-CM

## 2019-10-25 DIAGNOSIS — F419 Anxiety disorder, unspecified: Secondary | ICD-10-CM | POA: Diagnosis not present

## 2019-10-25 DIAGNOSIS — Z0000 Encounter for general adult medical examination without abnormal findings: Secondary | ICD-10-CM

## 2019-10-25 MED ORDER — FAMOTIDINE 20 MG PO TABS
20.0000 mg | ORAL_TABLET | Freq: Every day | ORAL | 1 refills | Status: DC
Start: 2019-10-25 — End: 2020-03-10

## 2019-10-25 NOTE — Assessment & Plan Note (Signed)
Chronic Controlled, stable Continue current dose of medication prozac 20 mg daily 

## 2019-10-25 NOTE — Assessment & Plan Note (Signed)
Chronic Controlled, stable Continue current dose of medication Adderall 10 mg daily

## 2019-10-26 LAB — CBC WITH DIFFERENTIAL/PLATELET
Absolute Monocytes: 581 cells/uL (ref 200–950)
Basophils Absolute: 51 cells/uL (ref 0–200)
Basophils Relative: 0.9 %
Eosinophils Absolute: 211 cells/uL (ref 15–500)
Eosinophils Relative: 3.7 %
HCT: 47 % (ref 38.5–50.0)
Hemoglobin: 15.6 g/dL (ref 13.2–17.1)
Lymphs Abs: 1733 cells/uL (ref 850–3900)
MCH: 29 pg (ref 27.0–33.0)
MCHC: 33.2 g/dL (ref 32.0–36.0)
MCV: 87.4 fL (ref 80.0–100.0)
MPV: 9.8 fL (ref 7.5–12.5)
Monocytes Relative: 10.2 %
Neutro Abs: 3124 cells/uL (ref 1500–7800)
Neutrophils Relative %: 54.8 %
Platelets: 282 10*3/uL (ref 140–400)
RBC: 5.38 10*6/uL (ref 4.20–5.80)
RDW: 12 % (ref 11.0–15.0)
Total Lymphocyte: 30.4 %
WBC: 5.7 10*3/uL (ref 3.8–10.8)

## 2019-10-26 LAB — COMPREHENSIVE METABOLIC PANEL
AG Ratio: 2.1 (calc) (ref 1.0–2.5)
ALT: 11 U/L (ref 9–46)
AST: 16 U/L (ref 10–40)
Albumin: 4.4 g/dL (ref 3.6–5.1)
Alkaline phosphatase (APISO): 67 U/L (ref 36–130)
BUN: 13 mg/dL (ref 7–25)
CO2: 27 mmol/L (ref 20–32)
Calcium: 9.6 mg/dL (ref 8.6–10.3)
Chloride: 106 mmol/L (ref 98–110)
Creat: 0.95 mg/dL (ref 0.60–1.35)
Globulin: 2.1 g/dL (calc) (ref 1.9–3.7)
Glucose, Bld: 96 mg/dL (ref 65–99)
Potassium: 4.4 mmol/L (ref 3.5–5.3)
Sodium: 139 mmol/L (ref 135–146)
Total Bilirubin: 0.7 mg/dL (ref 0.2–1.2)
Total Protein: 6.5 g/dL (ref 6.1–8.1)

## 2019-10-26 LAB — LIPID PANEL
Cholesterol: 134 mg/dL (ref <200)
HDL: 41 mg/dL (ref >=40)
LDL Cholesterol (Calc): 75 mg/dL (calc)
Non-HDL Cholesterol (Calc): 93 mg/dL (calc) (ref <130)
Total CHOL/HDL Ratio: 3.3 (calc) (ref <5.0)
Triglycerides: 97 mg/dL (ref <150)

## 2019-10-26 LAB — TSH: TSH: 2.13 mIU/L (ref 0.40–4.50)

## 2019-12-03 ENCOUNTER — Telehealth: Payer: Self-pay | Admitting: Internal Medicine

## 2019-12-03 DIAGNOSIS — F988 Other specified behavioral and emotional disorders with onset usually occurring in childhood and adolescence: Secondary | ICD-10-CM

## 2019-12-03 MED ORDER — AMPHETAMINE-DEXTROAMPHETAMINE 10 MG PO TABS: 10.0000 mg | ORAL_TABLET | Freq: Every day | ORAL | 0 refills | Status: DC

## 2019-12-03 NOTE — Telephone Encounter (Signed)
   1.Medication Requested:amphetamine-dextroamphetamine (ADDERALL) 10 MG tablet  2. Pharmacy (Name, Street, City):[facility] [place] [address] 3. On Med List: yes  4. Last Visit with PCP: 10/25/19  5. Next visit date with PCP: 05/01/20   Agent: Please be advised that RX refills may take up to 3 business days. We ask that you follow-up with your pharmacy.

## 2019-12-03 NOTE — Telephone Encounter (Signed)
Check [name] registry last filled 10/14/2019.Marland KitchenJohny Lewis

## 2020-01-05 ENCOUNTER — Telehealth: Payer: Self-pay | Admitting: Internal Medicine

## 2020-01-05 DIAGNOSIS — F988 Other specified behavioral and emotional disorders with onset usually occurring in childhood and adolescence: Secondary | ICD-10-CM

## 2020-01-05 MED ORDER — AMPHETAMINE-DEXTROAMPHETAMINE 10 MG PO TABS: 10.0000 mg | ORAL_TABLET | Freq: Every day | ORAL | 0 refills | Status: DC

## 2020-01-05 NOTE — Telephone Encounter (Signed)
amphetamine-dextroamphetamine (ADDERALL) 10 MG tablet Patient requesting a refill  [facility] [place] [place] Phone:  [phone]  Fax:  [phone]     Last seen- 10/25/2019 Next apt- 05/01/2020

## 2020-01-06 ENCOUNTER — Other Ambulatory Visit: Payer: Self-pay | Admitting: Internal Medicine

## 2020-03-10 ENCOUNTER — Ambulatory Visit (INDEPENDENT_AMBULATORY_CARE_PROVIDER_SITE_OTHER): Payer: BC Managed Care – PPO | Admitting: Internal Medicine

## 2020-03-10 ENCOUNTER — Other Ambulatory Visit: Payer: Self-pay

## 2020-03-10 ENCOUNTER — Encounter: Payer: Self-pay | Admitting: Internal Medicine

## 2020-03-10 DIAGNOSIS — K219 Gastro-esophageal reflux disease without esophagitis: Secondary | ICD-10-CM | POA: Diagnosis not present

## 2020-03-10 DIAGNOSIS — F3289 Other specified depressive episodes: Secondary | ICD-10-CM

## 2020-03-10 DIAGNOSIS — F4321 Adjustment disorder with depressed mood: Secondary | ICD-10-CM

## 2020-03-10 DIAGNOSIS — F988 Other specified behavioral and emotional disorders with onset usually occurring in childhood and adolescence: Secondary | ICD-10-CM | POA: Diagnosis not present

## 2020-03-10 DIAGNOSIS — F419 Anxiety disorder, unspecified: Secondary | ICD-10-CM

## 2020-03-10 MED ORDER — FAMOTIDINE 20 MG PO TABS: 20.0000 mg | ORAL_TABLET | Freq: Two times a day (BID) | ORAL | 1 refills | Status: DC

## 2020-03-10 MED ORDER — AMPHETAMINE-DEXTROAMPHETAMINE 10 MG PO TABS: 10.0000 mg | ORAL_TABLET | Freq: Every day | ORAL | 0 refills | Status: DC

## 2020-03-10 NOTE — Assessment & Plan Note (Signed)
Chronic GERD not controlled Increase pepcid to 20 mg BID

## 2020-03-10 NOTE — Assessment & Plan Note (Signed)
Chronic Controlled, stable Continue Adderall 10 mg daily  

## 2020-03-10 NOTE — Assessment & Plan Note (Signed)
Acute Wife died unexpectedly on 01-19-2023 and he is having difficulty working.  He needs to work and wants to work, but some days it is hard He has difficulty sleeping and concentrating is difficult Agree he would benefit from [facility] for intermittent leave for the next 6 months - 6 days a month as needed

## 2020-03-10 NOTE — Assessment & Plan Note (Signed)
Chronic Since he was here last his wife unexpectedly died and he is more depressed He does not feel increasing the prozac will help. He will establish with a therapist

## 2020-03-10 NOTE — Patient Instructions (Addendum)
We will fill out your FMLA paperwork.  This will be good for 6 months  Your pepcid will be increased to 20 mg twice daily.     [facility]

## 2020-03-10 NOTE — Progress Notes (Signed)
Subjective:    Patient ID: Micheal Lewis, male    DOB: 31-Aug-1995, 24 y.o.   MRN: [id number]  HPI The patient is here for an acute visit.    He wants have FMLA.  His wife recnetly died - she died on 01-Feb-2023 from acute pancreastitis.  She was 23 years old.  He has been trying to go to work, but somedays it is very hard.  Somedays he has not been able to go to work.  He needs to work and wants to work, but wants FMLA in case he can not work.   He wants fmla intermittent -  1-2 days / week  He is trying to get into therapy.  He knows it will help.  He does not feel like increasing the prozac would help.    He is having increased GERD - the pepcid is not strong enough  Medications and allergies reviewed with patient and updated if appropriate.  Patient Active Problem List   Diagnosis Date Noted  . Anal fissure 10/25/2019  . Closed fracture of capitellum of distal humerus, right, initial encounter 07/21/2019  . Anxiety 09/09/2018  . Depression 07/30/2015  . ADD (attention deficit disorder) 01/16/2015    Current Outpatient Medications on File Prior to Visit  Medication Sig Dispense Refill  . AMBULATORY NON FORMULARY MEDICATION Diltiazem: 2% with lidocaine 5% ointment Using your index finger, apply a small amount of medication inside the rectum up to your first knuckle/joint 4 times daily 30 g 0  . amphetamine-dextroamphetamine (ADDERALL) 10 MG tablet Take 1 tablet (10 mg total) by mouth daily with breakfast. 30 tablet 0  . famotidine (PEPCID) 20 MG tablet Take 1 tablet (20 mg total) by mouth daily. 90 tablet 1  . FLUoxetine (PROZAC) 20 MG capsule TAKE 1 CAPSULE BY MOUTH EVERY DAY 90 capsule 1  . Psyllium (METAMUCIL PO) Take by mouth as needed.      No current facility-administered medications on file prior to visit.    Past Medical History:  Diagnosis Date  . ADHD (attention deficit hyperactivity disorder)   . Anxiety   . Depression   . Sleep apnea    snores heavily     Past Surgical History:  Procedure Laterality Date  . WISDOM TOOTH EXTRACTION      Social History   Socioeconomic History  . Marital status: Married    Spouse name: Not on file  . Number of children: 0  . Years of education: Not on file  . Highest education level: High school graduate  Occupational History  . Not on file  Tobacco Use  . Smoking status: Never Smoker  . Smokeless tobacco: Never Used  Vaping Use  . Vaping Use: Never used  Substance and Sexual Activity  . Alcohol use: Yes    Comment: occasional  . Drug use: Yes    Types: Marijuana    Comment: occasional last use march 2021  . Sexual activity: Yes    Birth control/protection: None  Other Topics Concern  . Not on file  Social History Narrative   Exercises - run, lifts      Part time school, works at [employer] Strain: Not on file  Food Insecurity: Not on file  Transportation Needs: Not on file  Physical Activity: Not on file  Stress: Not on file  Social Connections: Not on file    Family History  Problem Relation Age of Onset  .  Hyperlipidemia Father   . Hypertension Father   . ADD / ADHD Father   . Post-traumatic stress disorder Mother   . Depression Sister   . Anxiety disorder Sister   . Depression Maternal Grandmother   . Alcohol abuse Paternal Uncle   . Depression Maternal Grandfather     Review of Systems  Constitutional: Negative for appetite change and fever.  Respiratory: Negative for shortness of breath.   Cardiovascular: Negative for chest pain and palpitations.  Neurological: Positive for dizziness (occ. lasts 1 sec). Negative for headaches.  Psychiatric/Behavioral: Positive for decreased concentration (worse than usual at times), dysphoric mood and sleep disturbance (hard to get to sleep, hard to get up and wakes up frequently). Negative for self-injury and suicidal ideas. The patient is nervous/anxious.         Objective:   Vitals:   03/10/20 1549  BP: 116/80  Pulse: 79  Temp: 98.9 F (37.2 C)  SpO2: 98%   BP Readings from Last 3 Encounters:  03/10/20 116/80  10/25/19 118/78  10/18/19 (!) 99/62   Wt Readings from Last 3 Encounters:  03/10/20 179 lb (81.2 kg)  10/25/19 175 lb (79.4 kg)  10/18/19 175 lb (79.4 kg)   Body mass index is 24.28 kg/m.   Physical Exam Constitutional:      General: He is not in acute distress.    Appearance: Normal appearance. He is not ill-appearing.  HENT:     Head: Normocephalic and atraumatic.  Skin:    General: Skin is warm and dry.  Neurological:     Mental Status: He is alert.  Psychiatric:        Behavior: Behavior normal.        Thought Content: Thought content normal.        Judgment: Judgment normal.     Comments: Depressed affect            Assessment & Plan:    See Problem List for Assessment and Plan of chronic medical problems.    This visit occurred during the SARS-CoV-2 public health emergency.  Safety protocols were in place, including screening questions prior to the visit, additional usage of staff PPE, and extensive cleaning of exam room while observing appropriate contact time as indicated for disinfecting solutions.

## 2020-03-13 ENCOUNTER — Telehealth: Payer: Self-pay | Admitting: Internal Medicine

## 2020-03-13 NOTE — Telephone Encounter (Signed)
I received FMLA for patient for 1to2 x a week.  LOV: 03/10/20  Forms have been completed &Placed in providers box to review and sign.

## 2020-03-13 NOTE — Telephone Encounter (Signed)
Patient came in and dropped off STD forms as well. He has went out of work as of today 12/20 and would like a return to work date of 01/03. Forms have been completed and placed in providers box to review and sign, If Dr.Burns agrees.

## 2020-03-14 DIAGNOSIS — Z0279 Encounter for issue of other medical certificate: Secondary | ICD-10-CM

## 2020-03-14 NOTE — Telephone Encounter (Signed)
Forms have been completed, Faxed to [phone], Copy sent to scan &Charged for.   LVM to inform patient, Original mailed to patient for his records.

## 2020-04-06 NOTE — Patient Instructions (Signed)
  Blood work was ordered.     Medications changes include :     Your prescription(s) have been submitted to your pharmacy. Please take as directed and contact our office if you believe you are having problem(s) with the medication(s).   A referral was ordered for        Someone from their office will call you to schedule an appointment.    Please followup in 6 months  

## 2020-04-06 NOTE — Progress Notes (Signed)
Subjective:    Patient ID: Micheal Lewis, male    DOB: [birth year], 25 y.o.   MRN: [id number]  HPI The patient is here for an acute visit.   Std testing -   Medications and allergies reviewed with patient and updated if appropriate.  Patient Active Problem List   Diagnosis Date Noted  . GERD (gastroesophageal reflux disease) 03/10/2020  . Grief 03/10/2020  . Anal fissure 10/25/2019  . Closed fracture of capitellum of distal humerus, right, initial encounter 07/21/2019  . Anxiety 09/09/2018  . Depression 07/30/2015  . ADD (attention deficit disorder) 01/16/2015    Current Outpatient Medications on File Prior to Visit  Medication Sig Dispense Refill  . AMBULATORY NON FORMULARY MEDICATION Diltiazem: 2% with lidocaine 5% ointment Using your index finger, apply a small amount of medication inside the rectum up to your first knuckle/joint 4 times daily 30 g 0  . amphetamine-dextroamphetamine (ADDERALL) 10 MG tablet Take 1 tablet (10 mg total) by mouth daily with breakfast. 30 tablet 0  . famotidine (PEPCID) 20 MG tablet Take 1 tablet (20 mg total) by mouth 2 (two) times daily. 180 tablet 1  . FLUoxetine (PROZAC) 20 MG capsule TAKE 1 CAPSULE BY MOUTH EVERY DAY 90 capsule 1  . Psyllium (METAMUCIL PO) Take by mouth as needed.      No current facility-administered medications on file prior to visit.    Past Medical History:  Diagnosis Date  . ADHD (attention deficit hyperactivity disorder)   . Anxiety   . Depression   . Sleep apnea    snores heavily    Past Surgical History:  Procedure Laterality Date  . WISDOM TOOTH EXTRACTION      Social History   Socioeconomic History  . Marital status: Married    Spouse name: Not on file  . Number of children: 0  . Years of education: Not on file  . Highest education level: High school graduate  Occupational History  . Not on file  Tobacco Use  . Smoking status: Never Smoker  . Smokeless tobacco: Never Used  Vaping Use  .  Vaping Use: Never used  Substance and Sexual Activity  . Alcohol use: Yes    Comment: occasional  . Drug use: Yes    Types: Marijuana    Comment: occasional last use march 2021  . Sexual activity: Yes    Birth control/protection: None  Other Topics Concern  . Not on file  Social History Narrative   Exercises - run, lifts      Part time school, works at [employer] Strain: Not on file  Food Insecurity: Not on file  Transportation Needs: Not on file  Physical Activity: Not on file  Stress: Not on file  Social Connections: Not on file    Family History  Problem Relation Age of Onset  . Hyperlipidemia Father   . Hypertension Father   . ADD / ADHD Father   . Post-traumatic stress disorder Mother   . Depression Sister   . Anxiety disorder Sister   . Depression Maternal Grandmother   . Alcohol abuse Paternal Uncle   . Depression Maternal Grandfather     Review of Systems     Objective:  There were no vitals filed for this visit. BP Readings from Last 3 Encounters:  03/10/20 116/80  10/25/19 118/78  10/18/19 (!) 99/62   Wt Readings from Last 3 Encounters:  03/10/20 179 lb (  81.2 kg)  10/25/19 175 lb (79.4 kg)  10/18/19 175 lb (79.4 kg)   There is no height or weight on file to calculate BMI.   Physical Exam         Assessment & Plan:    See Problem List for Assessment and Plan of chronic medical problems.    This visit occurred during the SARS-CoV-2 public health emergency.  Safety protocols were in place, including screening questions prior to the visit, additional usage of staff PPE, and extensive cleaning of exam room while observing appropriate contact time as indicated for disinfecting solutions.   This encounter was created in error - please disregard.

## 2020-04-07 ENCOUNTER — Encounter: Payer: Self-pay | Admitting: Internal Medicine

## 2020-04-07 ENCOUNTER — Encounter: Payer: BC Managed Care – PPO | Admitting: Internal Medicine

## 2020-04-07 DIAGNOSIS — Z0289 Encounter for other administrative examinations: Secondary | ICD-10-CM

## 2020-04-11 NOTE — Telephone Encounter (Signed)
Patient requesting leave be extend to 04/10/2020. Forms have been completed &Faxed. Original mailed to patient.

## 2020-04-12 ENCOUNTER — Other Ambulatory Visit: Payer: Self-pay

## 2020-04-12 ENCOUNTER — Encounter: Payer: Self-pay | Admitting: Internal Medicine

## 2020-04-12 ENCOUNTER — Ambulatory Visit (INDEPENDENT_AMBULATORY_CARE_PROVIDER_SITE_OTHER): Payer: BC Managed Care – PPO | Admitting: Internal Medicine

## 2020-04-12 VITALS — BP 120/84 | HR 88 | Temp 98.0°F | Ht 72.0 in | Wt 190.0 lb

## 2020-04-12 DIAGNOSIS — F3289 Other specified depressive episodes: Secondary | ICD-10-CM | POA: Diagnosis not present

## 2020-04-12 DIAGNOSIS — Z113 Encounter for screening for infections with a predominantly sexual mode of transmission: Secondary | ICD-10-CM

## 2020-04-12 DIAGNOSIS — F419 Anxiety disorder, unspecified: Secondary | ICD-10-CM | POA: Diagnosis not present

## 2020-04-12 NOTE — Assessment & Plan Note (Signed)
Wants screening for stds Denies exposure and has no symptoms Check hep c, rpr, hep C, herpes 1 and 2 and urine for C/ GC

## 2020-04-12 NOTE — Assessment & Plan Note (Signed)
Chronic Controlled, stable Continue prozac 20 mg daily  

## 2020-04-12 NOTE — Progress Notes (Signed)
Subjective:    Patient ID: Micheal Lewis, male    DOB: June 22, 1995, 25 y.o.   MRN: [id number]  HPI The patient is here for an acute visit.   He would like to have screening tests for stds.  He denies any symptoms.  He denies a history of stds.      He feels his depression and anxiety are controlled.  He does not feel he needs to adjust his medications.      Medications and allergies reviewed with patient and updated if appropriate.  Patient Active Problem List   Diagnosis Date Noted  . GERD (gastroesophageal reflux disease) 03/10/2020  . Grief 03/10/2020  . Anal fissure 10/25/2019  . Closed fracture of capitellum of distal humerus, right, initial encounter 07/21/2019  . Anxiety 09/09/2018  . Depression 07/30/2015  . ADD (attention deficit disorder) 01/16/2015    Current Outpatient Medications on File Prior to Visit  Medication Sig Dispense Refill  . AMBULATORY NON FORMULARY MEDICATION Diltiazem: 2% with lidocaine 5% ointment Using your index finger, apply a small amount of medication inside the rectum up to your first knuckle/joint 4 times daily 30 g 0  . amphetamine-dextroamphetamine (ADDERALL) 10 MG tablet Take 1 tablet (10 mg total) by mouth daily with breakfast. 30 tablet 0  . famotidine (PEPCID) 20 MG tablet Take 1 tablet (20 mg total) by mouth 2 (two) times daily. 180 tablet 1  . FLUoxetine (PROZAC) 20 MG capsule TAKE 1 CAPSULE BY MOUTH EVERY DAY 90 capsule 1  . Psyllium (METAMUCIL PO) Take by mouth as needed.      No current facility-administered medications on file prior to visit.    Past Medical History:  Diagnosis Date  . ADHD (attention deficit hyperactivity disorder)   . Anxiety   . Depression   . Sleep apnea    snores heavily    Past Surgical History:  Procedure Laterality Date  . WISDOM TOOTH EXTRACTION      Social History   Socioeconomic History  . Marital status: Married    Spouse name: Not on file  . Number of children: 0  . Years of  education: Not on file  . Highest education level: High school graduate  Occupational History  . Not on file  Tobacco Use  . Smoking status: Never Smoker  . Smokeless tobacco: Never Used  Vaping Use  . Vaping Use: Never used  Substance and Sexual Activity  . Alcohol use: Yes    Comment: occasional  . Drug use: Yes    Types: Marijuana    Comment: occasional last use march 2021  . Sexual activity: Yes    Birth control/protection: None  Other Topics Concern  . Not on file  Social History Narrative   Exercises - run, lifts      Part time school, works at [employer] Strain: Not on file  Food Insecurity: Not on file  Transportation Needs: Not on file  Physical Activity: Not on file  Stress: Not on file  Social Connections: Not on file    Family History  Problem Relation Age of Onset  . Hyperlipidemia Father   . Hypertension Father   . ADD / ADHD Father   . Post-traumatic stress disorder Mother   . Depression Sister   . Anxiety disorder Sister   . Depression Maternal Grandmother   . Alcohol abuse Paternal Uncle   . Depression Maternal Grandfather  Review of Systems  Constitutional: Negative for chills and fever.  Gastrointestinal: Positive for abdominal pain (from anxiety).  Genitourinary: Negative for difficulty urinating, dysuria, genital sores, hematuria and penile discharge.  Psychiatric/Behavioral: The patient is nervous/anxious.        Objective:   Vitals:   04/12/20 1325  BP: 120/84  Pulse: 88  Temp: 98 F (36.7 C)  SpO2: 95%   BP Readings from Last 3 Encounters:  04/12/20 120/84  03/10/20 116/80  10/25/19 118/78   Wt Readings from Last 3 Encounters:  04/12/20 190 lb (86.2 kg)  03/10/20 179 lb (81.2 kg)  10/25/19 175 lb (79.4 kg)   Body mass index is 25.77 kg/m.   Physical Exam Constitutional:      Appearance: Normal appearance.  Genitourinary:    Comments:  deferred Skin:    General: Skin is warm and dry.  Neurological:     Mental Status: He is alert.  Psychiatric:        Mood and Affect: Mood normal.            Assessment & Plan:    See Problem List for Assessment and Plan of chronic medical problems.    This visit occurred during the SARS-CoV-2 public health emergency.  Safety protocols were in place, including screening questions prior to the visit, additional usage of staff PPE, and extensive cleaning of exam room while observing appropriate contact time as indicated for disinfecting solutions.

## 2020-04-12 NOTE — Patient Instructions (Signed)
  Blood work/ urine tests was ordered.

## 2020-04-13 LAB — HEPATITIS C ANTIBODY
Hepatitis C Ab: NONREACTIVE
SIGNAL TO CUT-OFF: 0.01 (ref <1.00)

## 2020-04-13 LAB — RPR: RPR Ser Ql: NONREACTIVE

## 2020-04-13 LAB — HSV 2 ANTIBODY, IGG: HSV 2 Glycoprotein G Ab, IgG: <0.9 index

## 2020-04-13 LAB — HSV 1 ANTIBODY, IGG: HSV 1 Glycoprotein G Ab, IgG: <0.9 index

## 2020-04-13 LAB — HIV ANTIBODY (ROUTINE TESTING W REFLEX): HIV 1&2 Ab, 4th Generation: NONREACTIVE

## 2020-04-14 LAB — GC/CHLAMYDIA PROBE AMP
Chlamydia trachomatis, NAA: NEGATIVE
Neisseria Gonorrhoeae by PCR: NEGATIVE

## 2020-05-01 ENCOUNTER — Ambulatory Visit: Payer: BC Managed Care – PPO | Admitting: Internal Medicine

## 2020-06-05 ENCOUNTER — Telehealth: Payer: Self-pay | Admitting: Internal Medicine

## 2020-06-05 DIAGNOSIS — R059 Cough, unspecified: Secondary | ICD-10-CM | POA: Diagnosis not present

## 2020-06-05 DIAGNOSIS — J209 Acute bronchitis, unspecified: Secondary | ICD-10-CM | POA: Diagnosis not present

## 2020-06-05 DIAGNOSIS — Z20822 Contact with and (suspected) exposure to covid-19: Secondary | ICD-10-CM | POA: Diagnosis not present

## 2020-06-05 DIAGNOSIS — F988 Other specified behavioral and emotional disorders with onset usually occurring in childhood and adolescence: Secondary | ICD-10-CM

## 2020-06-05 NOTE — Telephone Encounter (Signed)
Patient requesting refill for amphetamine-dextroamphetamine (ADDERALL) 10 MG tablet  [facility]acy [facility],  - [address]

## 2020-06-06 MED ORDER — AMPHETAMINE-DEXTROAMPHETAMINE 10 MG PO TABS
10.0000 mg | ORAL_TABLET | Freq: Every day | ORAL | 0 refills | Status: DC
Start: 2020-06-06 — End: 2021-06-20

## 2020-06-07 ENCOUNTER — Telehealth: Payer: Self-pay | Admitting: Internal Medicine

## 2020-06-07 NOTE — Telephone Encounter (Signed)
He needs an appt - the last time we spoke he was doing well.     He may also need to see a psychiatrist

## 2020-06-07 NOTE — Telephone Encounter (Addendum)
Patient dropped off FMLA request with other staff in the office.  Patient is requesting 1 month leave starting 06/05/20 for PTSD.   This is not a diagnosis on patients problem list.  I would believe patient needs an appointment. Patient would like the provider to advise first.

## 2020-06-07 NOTE — Telephone Encounter (Signed)
LVM for patient to call the office back and set up an appointment. We will not be able to approve and sign forms until he has an appointment.

## 2020-06-07 NOTE — Telephone Encounter (Signed)
Appointment made for 06/09/20.

## 2020-06-08 NOTE — Progress Notes (Signed)
Subjective:    Patient ID: Micheal Lewis, male    DOB: January 30, 1996, 25 y.o.   MRN: [id number]  HPI The patient is here for an acute visit to discuss short term disability.  He states that works that he can take time off-as much as he needs.   He misses work very often - the majority of the week he does not get to work.  He cannot reliably get to work and needs to be on disability.  He states he currently does not like work.  He struggles to get to sleep at night and then when he does get to sleep he cannot get up in the morning to go to work.  He feels depressed and anxious.  He feels this is related to his wife's death.  He was doing okay initially, but just feels like it starting to hit him.  His parent are contributing because of how they were with her.  He just got in huge fight with his mother recently.  They did not care for his wife.  I saw him 2 months ago and he was doing very well.     He feels he is starting to process his wife's death.    He wanted to do short term disability for one month, maybe longer.      Earlier this week he did have feelings of not wanting to be alive, but realized he did not want to be living the way he is.  His last day of work was Monday, 06/05/2020.  He needs to start out of work 06/06/2020-off for 1 month Return to work 07/04/2020.  He will need to follow-up with me in 1 month 4/11   He admits he has not been religious about taking his Prozac and Adderall on a daily basis.  He states he does feel better when he takes them   Medications and allergies reviewed with patient and updated if appropriate.  Patient Active Problem List   Diagnosis Date Noted  . GERD (gastroesophageal reflux disease) 03/10/2020  . Grief 03/10/2020  . Anal fissure 10/25/2019  . Closed fracture of capitellum of distal humerus, right, initial encounter 07/21/2019  . Anxiety 09/09/2018  . Screening for STD (sexually transmitted disease) 04/28/2017  . Depression  07/30/2015  . ADD (attention deficit disorder) 01/16/2015    Current Outpatient Medications on File Prior to Visit  Medication Sig Dispense Refill  . albuterol (VENTOLIN HFA) 108 (90 Base) MCG/ACT inhaler Inhale 2 puffs into the lungs every 6 (six) hours as needed.    . AMBULATORY NON FORMULARY MEDICATION Diltiazem: 2% with lidocaine 5% ointment Using your index finger, apply a small amount of medication inside the rectum up to your first knuckle/joint 4 times daily 30 g 0  . amphetamine-dextroamphetamine (ADDERALL) 10 MG tablet Take 1 tablet (10 mg total) by mouth daily with breakfast. 30 tablet 0  . famotidine (PEPCID) 20 MG tablet Take 1 tablet (20 mg total) by mouth 2 (two) times daily. 180 tablet 1  . FLUoxetine (PROZAC) 20 MG capsule TAKE 1 CAPSULE BY MOUTH EVERY DAY 90 capsule 1  . Psyllium (METAMUCIL PO) Take by mouth as needed.      No current facility-administered medications on file prior to visit.    Past Medical History:  Diagnosis Date  . ADHD (attention deficit hyperactivity disorder)   . Anxiety   . Depression   . Sleep apnea    snores heavily    Past Surgical History:  Procedure Laterality Date  . WISDOM TOOTH EXTRACTION      Social History   Socioeconomic History  . Marital status: Married    Spouse name: Not on file  . Number of children: 0  . Years of education: Not on file  . Highest education level: High school graduate  Occupational History  . Not on file  Tobacco Use  . Smoking status: Never Smoker  . Smokeless tobacco: Never Used  Vaping Use  . Vaping Use: Never used  Substance and Sexual Activity  . Alcohol use: Yes    Comment: occasional  . Drug use: Yes    Types: Marijuana    Comment: occasional last use march 2021  . Sexual activity: Yes    Birth control/protection: None  Other Topics Concern  . Not on file  Social History Narrative   Exercises - run, lifts      Part time school, works at [employer] Strain: Not on file  Food Insecurity: Not on file  Transportation Needs: Not on file  Physical Activity: Not on file  Stress: Not on file  Social Connections: Not on file    Family History  Problem Relation Age of Onset  . Hyperlipidemia Father   . Hypertension Father   . ADD / ADHD Father   . Post-traumatic stress disorder Mother   . Depression Sister   . Anxiety disorder Sister   . Depression Maternal Grandmother   . Alcohol abuse Paternal Uncle   . Depression Maternal Grandfather     Review of Systems  Constitutional: Negative for appetite change.  Respiratory: Negative for shortness of breath.   Cardiovascular: Positive for chest pain (when he feels bad) and palpitations (when he feels bad).  Psychiatric/Behavioral: Positive for decreased concentration, dysphoric mood, sleep disturbance (dif going to sleep and then can not get up in the morning) and suicidal ideas (thoughts but no plans). The patient is nervous/anxious (denies panic attacks).        Objective:   Vitals:   06/09/20 1304  BP: 130/72  Pulse: (!) 107  Temp: 98.3 F (36.8 C)  SpO2: 97%   BP Readings from Last 3 Encounters:  06/09/20 130/72  04/12/20 120/84  03/10/20 116/80   Wt Readings from Last 3 Encounters:  06/09/20 188 lb (85.3 kg)  04/12/20 190 lb (86.2 kg)  03/10/20 179 lb (81.2 kg)   Body mass index is 25.5 kg/m.  Depression screen [facility] 2/9 06/09/2020 04/29/2019 08/07/2018 04/28/2017 04/02/2016  Decreased Interest 3 1 1 1  0  Down, Depressed, Hopeless 3 1 1  - 3  PHQ - 2 Score 6 2 2 1 3   Altered sleeping 3 3 1 1 3   Tired, decreased energy 3 1 2 1 2   Change in appetite 0 0 0 0 0  Feeling bad or failure about yourself  3 2 2  0 3  Trouble concentrating 1 3 3 3 3   Moving slowly or fidgety/restless 3 3 3 2  0  Suicidal thoughts 3 1 2  0 0  PHQ-9 Score 22 15 15 8 14      GAD 7 : Generalized Anxiety Score 06/09/2020 04/29/2019  Nervous, Anxious, on Edge 2 1   Control/stop worrying 3 1  Worry too much - different things 3 1  Trouble relaxing 3 1  Restless 1 3  Easily annoyed or irritable 3 2  Afraid - awful might happen 3 2  Total GAD 7 Score  18 11  Anxiety Difficulty Extremely difficult -       Physical Exam Constitutional:      General: He is not in acute distress.    Appearance: Normal appearance. He is not ill-appearing.  HENT:     Head: Normocephalic and atraumatic.  Skin:    General: Skin is warm and dry.  Neurological:     Mental Status: He is alert.  Psychiatric:        Behavior: Behavior normal.        Thought Content: Thought content normal.        Judgment: Judgment normal.     Comments: Fairly normal-does not seem overly depressed or anxious            Assessment & Plan:    Anxiety, depression, grief:  He is experiencing significant anxiety and depression, related to grief from his wife's unexpected death in Jan 31, 2020 He is having difficulty sleeping and has difficulty getting up in the morning and not going to work regularly He has not been taking his medications on a daily basis-stressed that he needs to be taking the fluoxetine and Adderall daily since he does feel better when he does He declined any changes in his medication Stressed that he needs to see a therapist and psychiatrist-at first he declined, but I expressed to him that if he is going on FMLA he needs to see both a therapist and a psychiatrist I agree to 1 month FMLA-short-term disability from work Out of work 06/07/2018 2-47-month, return to work 07/04/2020 He will follow-up with me 07/03/2020.  I did tell them that I will not extend this-this can be extended by a psychiatrist if he/she feels it is necessary.  I do feel he needs further evaluation by psych first and needs to see a therapist    I spent 20 minutes dedicated to the care of this patient on the date of this encounter including communicating with the patient, doing paperwork, and  documenting clinical information in the EHR    This visit occurred during the SARS-CoV-2 public health emergency.  Safety protocols were in place, including screening questions prior to the visit, additional usage of staff PPE, and extensive cleaning of exam room while observing appropriate contact time as indicated for disinfecting solutions.

## 2020-06-09 ENCOUNTER — Encounter: Payer: Self-pay | Admitting: Internal Medicine

## 2020-06-09 ENCOUNTER — Ambulatory Visit (INDEPENDENT_AMBULATORY_CARE_PROVIDER_SITE_OTHER): Payer: BC Managed Care – PPO | Admitting: Internal Medicine

## 2020-06-09 ENCOUNTER — Other Ambulatory Visit: Payer: Self-pay

## 2020-06-09 VITALS — BP 130/72 | HR 107 | Temp 98.3°F | Ht 72.0 in | Wt 188.0 lb

## 2020-06-09 DIAGNOSIS — F4321 Adjustment disorder with depressed mood: Secondary | ICD-10-CM

## 2020-06-09 DIAGNOSIS — F3289 Other specified depressive episodes: Secondary | ICD-10-CM | POA: Diagnosis not present

## 2020-06-09 DIAGNOSIS — F419 Anxiety disorder, unspecified: Secondary | ICD-10-CM

## 2020-06-09 NOTE — Patient Instructions (Addendum)
Follow up with me in one month on 07/03/20    [facility] [place]  Dr Mel Almond - [phone] Dr Terrance Mass   [phone] Dr Ernestene Mention   [phone]  [facility] [place] [phone]  Dr Letta Moynahan [address] [place]  [place] [address] [phone]  [facility] [place] [phone] Office  [facility]            [facility] [address] [place] [phone]

## 2020-06-09 NOTE — Telephone Encounter (Signed)
Forms have been completed - Return date 07/04/20, Placed in providers box to review and sign.

## 2020-06-12 DIAGNOSIS — Z0279 Encounter for issue of other medical certificate: Secondary | ICD-10-CM

## 2020-06-12 NOTE — Telephone Encounter (Signed)
Forms have been signed, Faxed to [email or web address] [phone], Copy sent to scan &Charged for.   LVM to inform patient, Original ready to be picked up - Happy to mail it to patient if he would like.

## 2020-06-19 DIAGNOSIS — F321 Major depressive disorder, single episode, moderate: Secondary | ICD-10-CM | POA: Diagnosis not present

## 2020-06-28 IMAGING — CT CT ELBOW*R* W/O CM
3 of 8 series · 16 of 36 positions shown, 18 images · non-contrast
Comparison: Radiographs 07/18/2019

CLINICAL DATA: Evaluate capitellar fracture.

EXAM:
CT OF THE LOWER RIGHT EXTREMITY WITHOUT CONTRAST
TECHNIQUE: Multidetector CT imaging of the right lower extremity was performed
according to the standard protocol.

[Series 9: elbow 1.50 br60 s3 sag bone hd fov · sagittal · 0.35mm/px · 6 of 186 slices shown]
[im 8/186  soft-tissue]
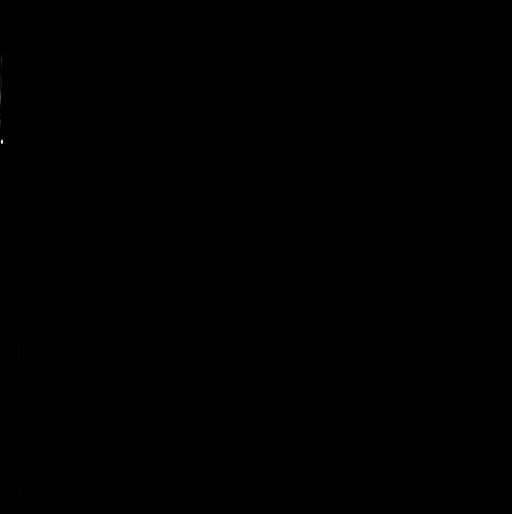
[im 31/186  bone]
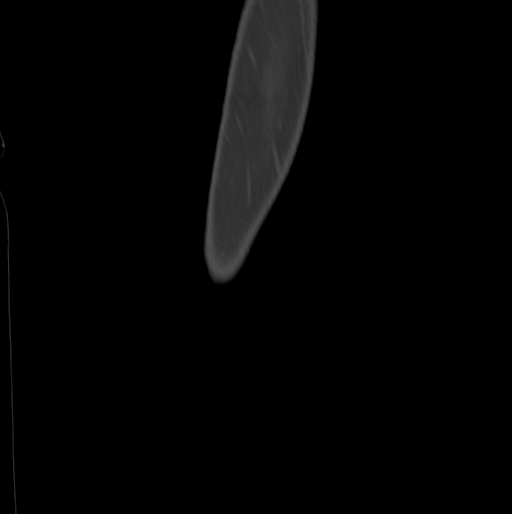
[im 62/186  bone]
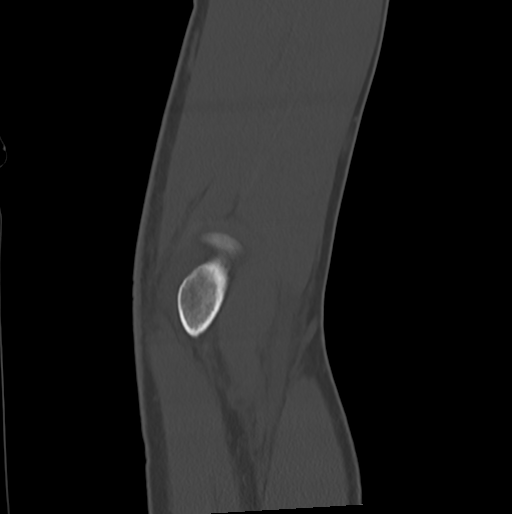
[im 93/186  bone]
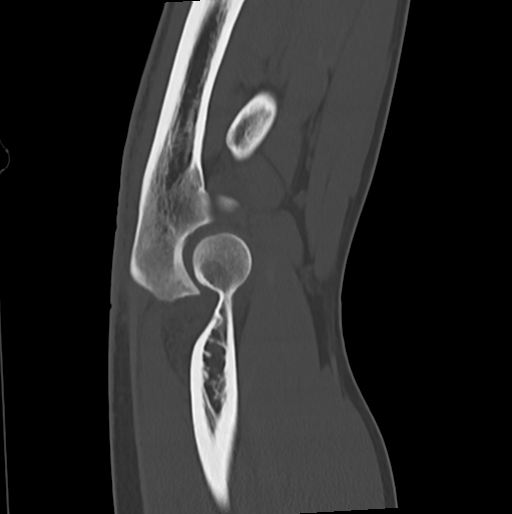
[im 124/186  bone]
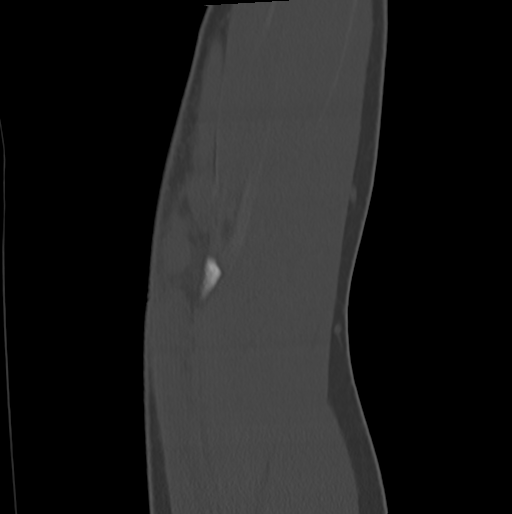
[im 155/186  bone]
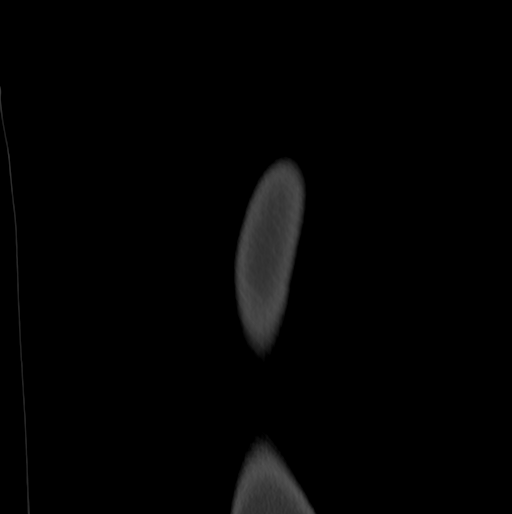

[Series 13: elbow 1.50 br60 s3 axial bone hd fov · axial · 0.27mm/px · z∈[-810,-694]mm · 5 of 220 slices shown, 7 images]
[im 37/220  soft-tissue]
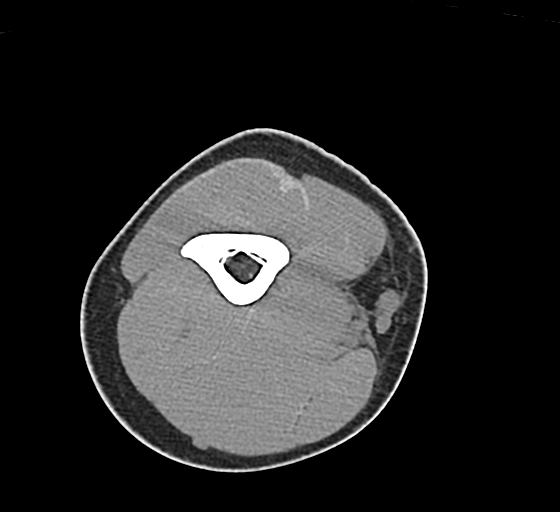
[im 37/220  bone]
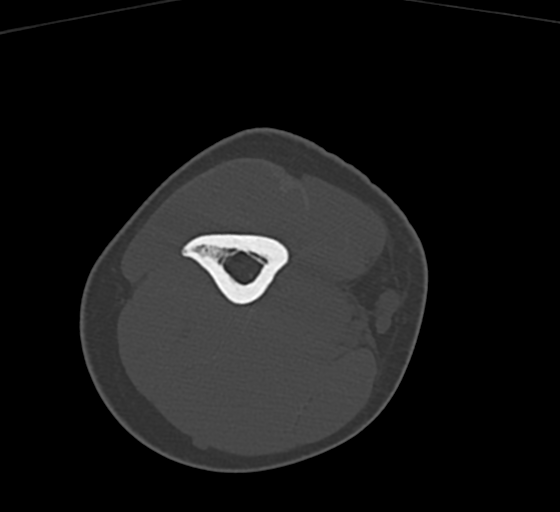
[im 74/220  bone]
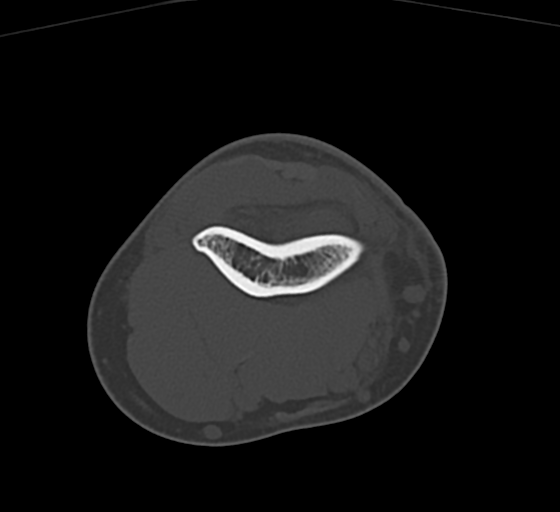
[im 110/220  bone]
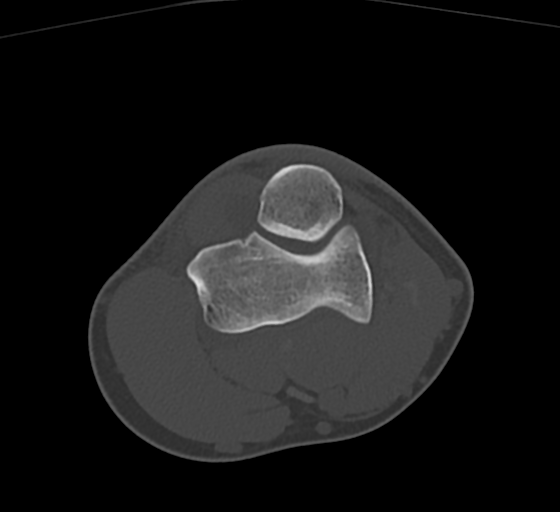
[im 147/220  bone]
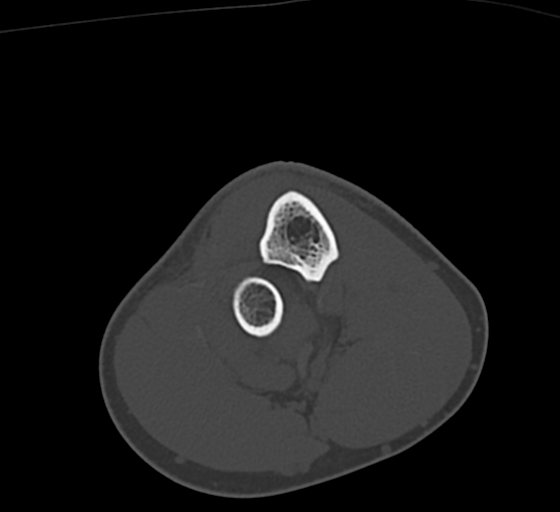
[im 183/220  soft-tissue]
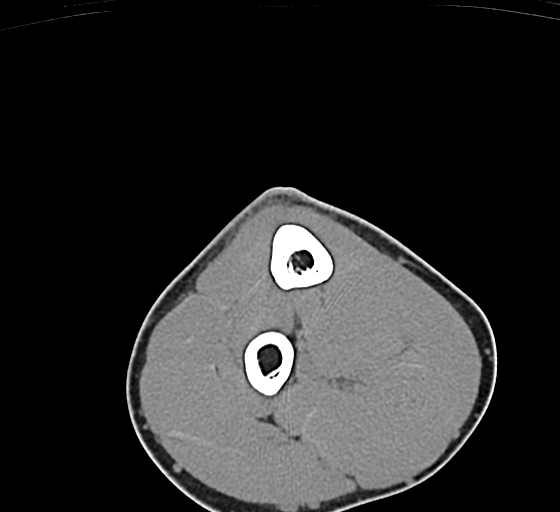
[im 183/220  bone]
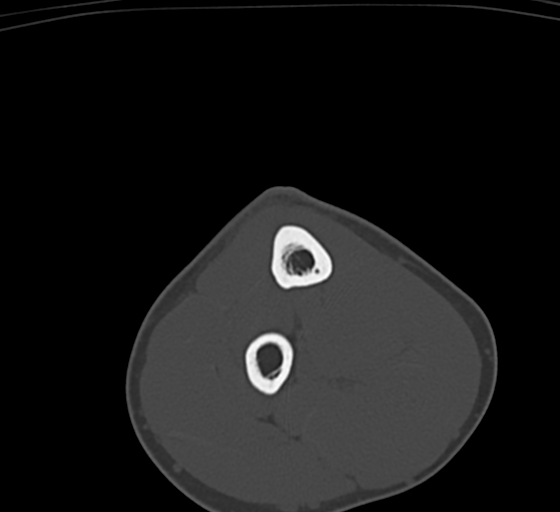

[Series 15: elbow 1.50 br40 s3 axial st hd fov · axial · 0.27mm/px · z∈[-810,-694]mm · 5 of 220 slices shown]
[im 37/220  bone]
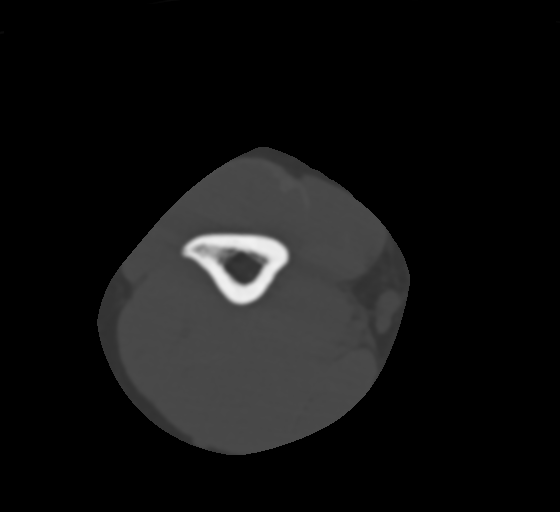
[im 74/220  bone]
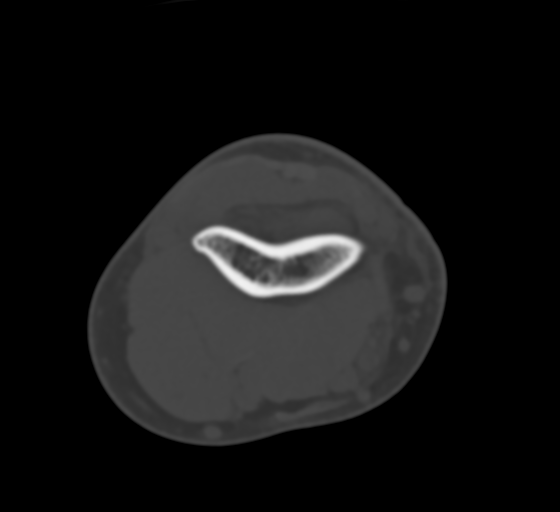
[im 110/220  bone]
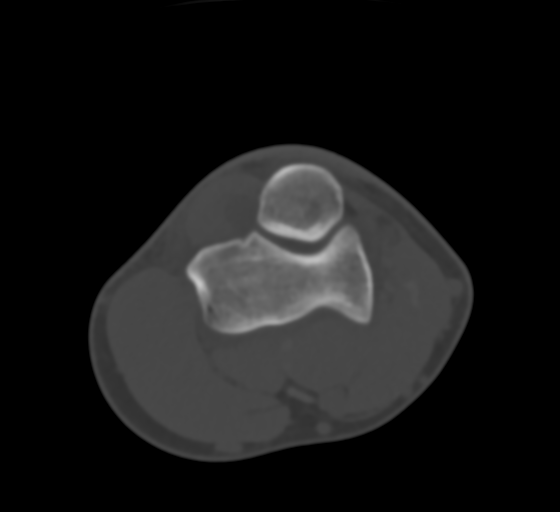
[im 147/220  bone]
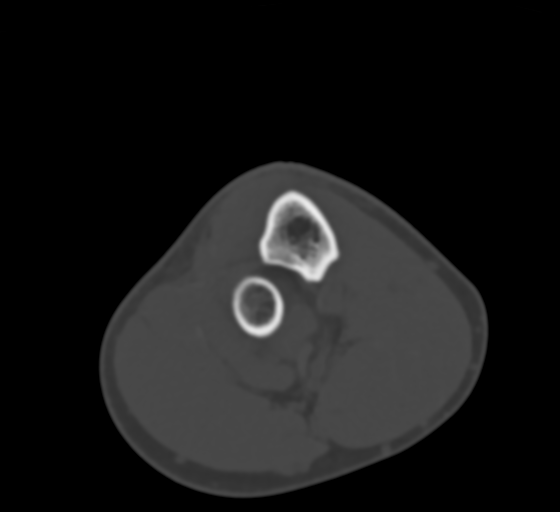
[im 183/220  bone]
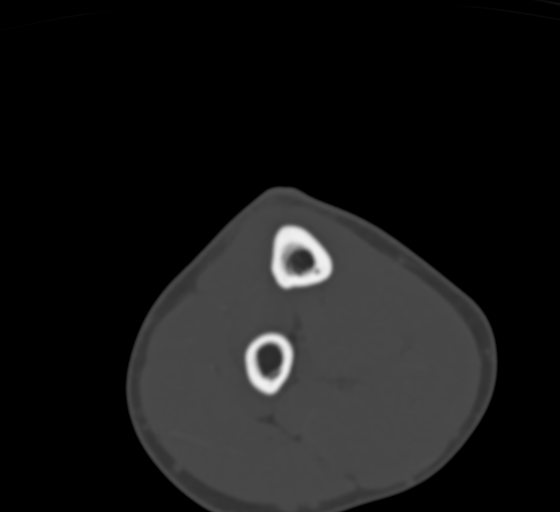

[16 of 36 positions shown; findings below may reference images not displayed]

FINDINGS: There is a small focal die punch type impaction fracture involving
the articular surface of the capitellum. Maximum impaction is
estimated at 3 mm. The fractures approximately 7 mm in size. There
are small fracture fragments noted in the joint.

A second component of this fracture is a longitudinal subcortical
fracture involving the radial cortex of the capitellum. Has the
appearance of an avulsion fracture but on the soft tissue windows I
believe the radial collateral ligament is intact and this fracture
is more inferior to its attachment site.

The radial head neck or intact. No other elbow fractures are
identified. There is an associated moderate-sized elbow joint
effusion.

The biceps, brachialis and triceps tendons are intact.

The flexor and extensor tendons appear intact. The ulnar collateral
ligament appears to be grossly intact by CT.
IMPRESSION: 1. Small focal die punch type impaction fracture involving the
articular surface of the capitellum. Maximum impaction is estimated
at 3 mm.
2. Small fracture fragments noted in the joint.
3. Transverse subcortical fracture involving the radial cortex of
the capitellum.
4. No other elbow fractures are identified.
5. Moderate-sized elbow joint effusion.

## 2020-07-02 NOTE — Progress Notes (Deleted)
Subjective:    Patient ID: Micheal Lewis, male    DOB: 07-Aug-1995, 25 y.o.   MRN: [id number]  HPI The patient is here for follow up of their chronic medical problems, including depression, anxiety, grieving.    He has been taking his medication daily.    Medications and allergies reviewed with patient and updated if appropriate.  Patient Active Problem List   Diagnosis Date Noted  . GERD (gastroesophageal reflux disease) 03/10/2020  . Grief 03/10/2020  . Anal fissure 10/25/2019  . Closed fracture of capitellum of distal humerus, right, initial encounter 07/21/2019  . Anxiety 09/09/2018  . Screening for STD (sexually transmitted disease) 04/28/2017  . Depression 07/30/2015  . ADD (attention deficit disorder) 01/16/2015    Current Outpatient Medications on File Prior to Visit  Medication Sig Dispense Refill  . albuterol (VENTOLIN HFA) 108 (90 Base) MCG/ACT inhaler Inhale 2 puffs into the lungs every 6 (six) hours as needed.    . AMBULATORY NON FORMULARY MEDICATION Diltiazem: 2% with lidocaine 5% ointment Using your index finger, apply a small amount of medication inside the rectum up to your first knuckle/joint 4 times daily 30 g 0  . amphetamine-dextroamphetamine (ADDERALL) 10 MG tablet Take 1 tablet (10 mg total) by mouth daily with breakfast. 30 tablet 0  . famotidine (PEPCID) 20 MG tablet Take 1 tablet (20 mg total) by mouth 2 (two) times daily. 180 tablet 1  . FLUoxetine (PROZAC) 20 MG capsule TAKE 1 CAPSULE BY MOUTH EVERY DAY 90 capsule 1  . Psyllium (METAMUCIL PO) Take by mouth as needed.      No current facility-administered medications on file prior to visit.    Past Medical History:  Diagnosis Date  . ADHD (attention deficit hyperactivity disorder)   . Anxiety   . Depression   . Sleep apnea    snores heavily    Past Surgical History:  Procedure Laterality Date  . WISDOM TOOTH EXTRACTION      Social History   Socioeconomic History  . Marital status:  Married    Spouse name: Not on file  . Number of children: 0  . Years of education: Not on file  . Highest education level: High school graduate  Occupational History  . Not on file  Tobacco Use  . Smoking status: Never Smoker  . Smokeless tobacco: Never Used  Vaping Use  . Vaping Use: Never used  Substance and Sexual Activity  . Alcohol use: Yes    Comment: occasional  . Drug use: Yes    Types: Marijuana    Comment: occasional last use march 2021  . Sexual activity: Yes    Birth control/protection: None  Other Topics Concern  . Not on file  Social History Narrative   Exercises - run, lifts      Part time school, works at [employer] Strain: Not on file  Food Insecurity: Not on file  Transportation Needs: Not on file  Physical Activity: Not on file  Stress: Not on file  Social Connections: Not on file    Family History  Problem Relation Age of Onset  . Hyperlipidemia Father   . Hypertension Father   . ADD / ADHD Father   . Post-traumatic stress disorder Mother   . Depression Sister   . Anxiety disorder Sister   . Depression Maternal Grandmother   . Alcohol abuse Paternal Uncle   . Depression Maternal Grandfather  Review of Systems     Objective:  There were no vitals filed for this visit. BP Readings from Last 3 Encounters:  06/09/20 130/72  04/12/20 120/84  03/10/20 116/80   Wt Readings from Last 3 Encounters:  06/09/20 188 lb (85.3 kg)  04/12/20 190 lb (86.2 kg)  03/10/20 179 lb (81.2 kg)   There is no height or weight on file to calculate BMI.   Physical Exam    Constitutional: Appears well-developed and well-nourished. No distress.  HENT:  Head: Normocephalic and atraumatic.  Neck: Neck supple. No tracheal deviation present. No thyromegaly present.  No cervical lymphadenopathy Cardiovascular: Normal rate, regular rhythm and normal heart sounds.   No murmur heard. No carotid  bruit .  No edema Pulmonary/Chest: Effort normal and breath sounds normal. No respiratory distress. No has no wheezes. No rales.  Skin: Skin is warm and dry. Not diaphoretic.  Psychiatric: Normal mood and affect. Behavior is normal.      Assessment & Plan:    See Problem List for Assessment and Plan of chronic medical problems.    This visit occurred during the SARS-CoV-2 public health emergency.  Safety protocols were in place, including screening questions prior to the visit, additional usage of staff PPE, and extensive cleaning of exam room while observing appropriate contact time as indicated for disinfecting solutions.

## 2020-07-03 ENCOUNTER — Ambulatory Visit: Payer: BC Managed Care – PPO | Admitting: Internal Medicine

## 2020-07-03 DIAGNOSIS — Z0289 Encounter for other administrative examinations: Secondary | ICD-10-CM

## 2020-07-30 NOTE — Progress Notes (Signed)
Subjective:    Patient ID: Micheal Lewis, male    DOB: [birth year], 25 y.o.   MRN: [id number]  HPI The patient is here for follow up of their chronic medical problems, including depression, anxiety, ADD, GERD    Medications and allergies reviewed with patient and updated if appropriate.  Patient Active Problem List   Diagnosis Date Noted  . GERD (gastroesophageal reflux disease) 03/10/2020  . Grief 03/10/2020  . Anal fissure 10/25/2019  . Closed fracture of capitellum of distal humerus, right, initial encounter 07/21/2019  . Anxiety 09/09/2018  . Screening for STD (sexually transmitted disease) 04/28/2017  . Depression 07/30/2015  . ADD (attention deficit disorder) 01/16/2015    Current Outpatient Medications on File Prior to Visit  Medication Sig Dispense Refill  . albuterol (VENTOLIN HFA) 108 (90 Base) MCG/ACT inhaler Inhale 2 puffs into the lungs every 6 (six) hours as needed.    . AMBULATORY NON FORMULARY MEDICATION Diltiazem: 2% with lidocaine 5% ointment Using your index finger, apply a small amount of medication inside the rectum up to your first knuckle/joint 4 times daily 30 g 0  . amphetamine-dextroamphetamine (ADDERALL) 10 MG tablet Take 1 tablet (10 mg total) by mouth daily with breakfast. 30 tablet 0  . famotidine (PEPCID) 20 MG tablet Take 1 tablet (20 mg total) by mouth 2 (two) times daily. 180 tablet 1  . FLUoxetine (PROZAC) 20 MG capsule TAKE 1 CAPSULE BY MOUTH EVERY DAY 90 capsule 1  . Psyllium (METAMUCIL PO) Take by mouth as needed.      No current facility-administered medications on file prior to visit.    Past Medical History:  Diagnosis Date  . ADHD (attention deficit hyperactivity disorder)   . Anxiety   . Depression   . Sleep apnea    snores heavily    Past Surgical History:  Procedure Laterality Date  . WISDOM TOOTH EXTRACTION      Social History   Socioeconomic History  . Marital status: Married    Spouse name: Not on file  .  Number of children: 0  . Years of education: Not on file  . Highest education level: High school graduate  Occupational History  . Not on file  Tobacco Use  . Smoking status: Never Smoker  . Smokeless tobacco: Never Used  Vaping Use  . Vaping Use: Never used  Substance and Sexual Activity  . Alcohol use: Yes    Comment: occasional  . Drug use: Yes    Types: Marijuana    Comment: occasional last use march 2021  . Sexual activity: Yes    Birth control/protection: None  Other Topics Concern  . Not on file  Social History Narrative   Exercises - run, lifts      Part time school, works at [employer] Strain: Not on file  Food Insecurity: Not on file  Transportation Needs: Not on file  Physical Activity: Not on file  Stress: Not on file  Social Connections: Not on file    Family History  Problem Relation Age of Onset  . Hyperlipidemia Father   . Hypertension Father   . ADD / ADHD Father   . Post-traumatic stress disorder Mother   . Depression Sister   . Anxiety disorder Sister   . Depression Maternal Grandmother   . Alcohol abuse Paternal Uncle   . Depression Maternal Grandfather     Review of Systems  Objective:  There were no vitals filed for this visit. BP Readings from Last 3 Encounters:  06/09/20 130/72  04/12/20 120/84  03/10/20 116/80   Wt Readings from Last 3 Encounters:  06/09/20 188 lb (85.3 kg)  04/12/20 190 lb (86.2 kg)  03/10/20 179 lb (81.2 kg)   There is no height or weight on file to calculate BMI.   Physical Exam    Constitutional: Appears well-developed and well-nourished. No distress.  HENT:  Head: Normocephalic and atraumatic.  Neck: Neck supple. No tracheal deviation present. No thyromegaly present.  No cervical lymphadenopathy Cardiovascular: Normal rate, regular rhythm and normal heart sounds.   No murmur heard. No carotid bruit .  No edema Pulmonary/Chest: Effort  normal and breath sounds normal. No respiratory distress. No has no wheezes. No rales.  Skin: Skin is warm and dry. Not diaphoretic.  Psychiatric: Normal mood and affect. Behavior is normal.      Assessment & Plan:    See Problem List for Assessment and Plan of chronic medical problems.    This visit occurred during the SARS-CoV-2 public health emergency.  Safety protocols were in place, including screening questions prior to the visit, additional usage of staff PPE, and extensive cleaning of exam room while observing appropriate contact time as indicated for disinfecting solutions.    This encounter was created in error - please disregard.

## 2020-07-31 ENCOUNTER — Encounter: Payer: BC Managed Care – PPO | Admitting: Internal Medicine

## 2020-07-31 DIAGNOSIS — F988 Other specified behavioral and emotional disorders with onset usually occurring in childhood and adolescence: Secondary | ICD-10-CM

## 2020-07-31 DIAGNOSIS — F3289 Other specified depressive episodes: Secondary | ICD-10-CM

## 2020-07-31 DIAGNOSIS — F419 Anxiety disorder, unspecified: Secondary | ICD-10-CM

## 2020-07-31 DIAGNOSIS — K219 Gastro-esophageal reflux disease without esophagitis: Secondary | ICD-10-CM

## 2020-08-17 ENCOUNTER — Other Ambulatory Visit: Payer: Self-pay | Admitting: Internal Medicine

## 2020-09-11 ENCOUNTER — Ambulatory Visit: Payer: BC Managed Care – PPO | Admitting: Internal Medicine

## 2021-06-14 NOTE — Progress Notes (Deleted)
? ? ? ? ?  Subjective:  ? ? Patient ID: Micheal Lewis, male    DOB: October 18, 1995, 26 y.o.   MRN: [id number] ? ?This visit occurred during the SARS-CoV-2 public health emergency.  Safety protocols were in place, including screening questions prior to the visit, additional usage of staff PPE, and extensive cleaning of exam room while observing appropriate contact time as indicated for disinfecting solutions.   ? ? ?HPI ?Micheal Lewis is here for follow up of his chronic medical problems, including depression, anxiety, ADD ? ? ? ?Medications and allergies reviewed with patient and updated if appropriate. ? ?Current Outpatient Medications on File Prior to Visit  ?Medication Sig Dispense Refill  ? albuterol (VENTOLIN HFA) 108 (90 Base) MCG/ACT inhaler Inhale 2 puffs into the lungs every 6 (six) hours as needed.    ? AMBULATORY NON FORMULARY MEDICATION Diltiazem: 2% with lidocaine 5% ointment ?Using your index finger, apply a small amount of medication inside the rectum up to your first knuckle/joint 4 times daily 30 g 0  ? amphetamine-dextroamphetamine (ADDERALL) 10 MG tablet Take 1 tablet (10 mg total) by mouth daily with breakfast. 30 tablet 0  ? famotidine (PEPCID) 20 MG tablet TAKE 1 TABLET BY MOUTH EVERY DAY 90 tablet 1  ? FLUoxetine (PROZAC) 20 MG capsule TAKE 1 CAPSULE BY MOUTH EVERY DAY 90 capsule 1  ? Psyllium (METAMUCIL PO) Take by mouth as needed.     ? ?No current facility-administered medications on file prior to visit.  ? ? ? ?Review of Systems ? ?   ?Objective:  ?There were no vitals filed for this visit. ?BP Readings from Last 3 Encounters:  ?06/09/20 130/72  ?04/12/20 120/84  ?03/10/20 116/80  ? ?Wt Readings from Last 3 Encounters:  ?06/09/20 188 lb (85.3 kg)  ?04/12/20 190 lb (86.2 kg)  ?03/10/20 179 lb (81.2 kg)  ? ?There is no height or weight on file to calculate BMI. ? ?  ?Physical Exam ?   ? ?Lab Results  ?Component Value Date  ? WBC 5.7 10/25/2019  ? HGB 15.6 10/25/2019  ? HCT 47.0 10/25/2019  ? PLT 282  10/25/2019  ? GLUCOSE 96 10/25/2019  ? CHOL 134 10/25/2019  ? TRIG 97 10/25/2019  ? HDL 41 10/25/2019  ? [place] 75 10/25/2019  ? ALT 11 10/25/2019  ? AST 16 10/25/2019  ? NA 139 10/25/2019  ? K 4.4 10/25/2019  ? CL 106 10/25/2019  ? CREATININE 0.95 10/25/2019  ? BUN 13 10/25/2019  ? CO2 27 10/25/2019  ? TSH 2.13 10/25/2019  ? ? ? ?Assessment & Plan:  ? ? ?See Problem List for Assessment and Plan of chronic medical problems.  ? ? ?

## 2021-06-15 ENCOUNTER — Ambulatory Visit: Payer: BC Managed Care – PPO | Admitting: Internal Medicine

## 2021-06-19 NOTE — Progress Notes (Signed)
? ? ? ? ?Subjective:  ? ? Patient ID: Micheal Lewis, male    DOB: 12/27/1995, 26 y.o.   MRN: [id number] ? ?This visit occurred during the SARS-CoV-2 public health emergency.  Safety protocols were in place, including screening questions prior to the visit, additional usage of staff PPE, and extensive cleaning of exam room while observing appropriate contact time as indicated for disinfecting solutions.   ? ? ?HPI ?Micheal Lewis is here for follow up of his chronic medical problems, including depression, anxiety, ADD, gerd ? ?He wants to get back onto meds.  ? ?Recently he has been waking up and vomiting.  It started about one month ago.  He wakes up feels nauseous and will then vomit.  He has frequent gerd.  It has gotten to be pretty regular.  He wonders if it is anxiety.  ? ? ? ?Medications and allergies reviewed with patient and updated if appropriate. ? ?Current Outpatient Medications on File Prior to Visit  ?Medication Sig Dispense Refill  ? albuterol (VENTOLIN HFA) 108 (90 Base) MCG/ACT inhaler Inhale 2 puffs into the lungs every 6 (six) hours as needed.    ? AMBULATORY NON FORMULARY MEDICATION Diltiazem: 2% with lidocaine 5% ointment ?Using your index finger, apply a small amount of medication inside the rectum up to your first knuckle/joint 4 times daily 30 g 0  ? amphetamine-dextroamphetamine (ADDERALL) 10 MG tablet Take 1 tablet (10 mg total) by mouth daily with breakfast. 30 tablet 0  ? famotidine (PEPCID) 20 MG tablet TAKE 1 TABLET BY MOUTH EVERY DAY 90 tablet 1  ? FLUoxetine (PROZAC) 20 MG capsule TAKE 1 CAPSULE BY MOUTH EVERY DAY 90 capsule 1  ? Psyllium (METAMUCIL PO) Take by mouth as needed.     ? ?No current facility-administered medications on file prior to visit.  ? ? ? ?Review of Systems  ?Constitutional:  Negative for fever.  ?Respiratory:  Negative for cough, shortness of breath and wheezing.   ?Cardiovascular:  Positive for chest pain. Negative for palpitations and leg swelling.  ?Gastrointestinal:   Positive for nausea and vomiting. Negative for abdominal pain, blood in stool (no black stool), constipation and diarrhea.  ?     Freq gerd  ?Genitourinary:  Negative for dysuria.  ?Neurological:  Positive for headaches (frontal and temple). Negative for light-headedness.  ?Psychiatric/Behavioral:  Positive for dysphoric mood. Negative for suicidal ideas. The patient is nervous/anxious.   ? ?   ?Objective:  ? ?Vitals:  ? 06/20/21 1542 06/20/21 1554  ?BP: 114/74 130/82  ?Pulse: 98   ?Temp: 98.2 ?F (36.8 ?C)   ?SpO2: 97%   ? ?BP Readings from Last 3 Encounters:  ?06/20/21 130/82  ?06/09/20 130/72  ?04/12/20 120/84  ? ?Wt Readings from Last 3 Encounters:  ?06/20/21 180 lb (81.6 kg)  ?06/09/20 188 lb (85.3 kg)  ?04/12/20 190 lb (86.2 kg)  ? ?Body mass index is 24.41 kg/m?. ? ?  ?Physical Exam ?Constitutional:   ?   General: He is not in acute distress. ?   Appearance: Normal appearance. He is not ill-appearing.  ?HENT:  ?   Head: Normocephalic and atraumatic.  ?Eyes:  ?   Conjunctiva/sclera: Conjunctivae normal.  ?Cardiovascular:  ?   Rate and Rhythm: Normal rate and regular rhythm.  ?   Heart sounds: Normal heart sounds. No murmur heard. ?Pulmonary:  ?   Effort: Pulmonary effort is normal. No respiratory distress.  ?   Breath sounds: Normal breath sounds. No wheezing or rales.  ?Abdominal:  ?  General: There is no distension.  ?   Palpations: Abdomen is soft.  ?   Tenderness: There is no abdominal tenderness. There is no guarding or rebound.  ?Musculoskeletal:  ?   Right lower leg: No edema.  ?   Left lower leg: No edema.  ?Skin: ?   General: Skin is warm and dry.  ?   Findings: No rash.  ?Neurological:  ?   Mental Status: He is alert. Mental status is at baseline.  ?Psychiatric:     ?   Mood and Affect: Mood normal.  ? ?   ? ?Lab Results  ?Component Value Date  ? WBC 5.7 10/25/2019  ? HGB 15.6 10/25/2019  ? HCT 47.0 10/25/2019  ? PLT 282 10/25/2019  ? GLUCOSE 96 10/25/2019  ? CHOL 134 10/25/2019  ? TRIG 97 10/25/2019   ? HDL 41 10/25/2019  ? [place] 75 10/25/2019  ? ALT 11 10/25/2019  ? AST 16 10/25/2019  ? NA 139 10/25/2019  ? K 4.4 10/25/2019  ? CL 106 10/25/2019  ? CREATININE 0.95 10/25/2019  ? BUN 13 10/25/2019  ? CO2 27 10/25/2019  ? TSH 2.13 10/25/2019  ? ? ? ?Assessment & Plan:  ? ? ?See Problem List for Assessment and Plan of chronic medical problems.  ? ? ?

## 2021-06-20 ENCOUNTER — Encounter: Payer: Self-pay | Admitting: Internal Medicine

## 2021-06-20 ENCOUNTER — Ambulatory Visit (INDEPENDENT_AMBULATORY_CARE_PROVIDER_SITE_OTHER): Payer: BC Managed Care – PPO | Admitting: Internal Medicine

## 2021-06-20 VITALS — BP 130/82 | HR 98 | Temp 98.2°F | Ht 72.0 in | Wt 180.0 lb

## 2021-06-20 DIAGNOSIS — F3289 Other specified depressive episodes: Secondary | ICD-10-CM | POA: Diagnosis not present

## 2021-06-20 DIAGNOSIS — R112 Nausea with vomiting, unspecified: Secondary | ICD-10-CM

## 2021-06-20 DIAGNOSIS — K219 Gastro-esophageal reflux disease without esophagitis: Secondary | ICD-10-CM

## 2021-06-20 DIAGNOSIS — B372 Candidiasis of skin and nail: Secondary | ICD-10-CM

## 2021-06-20 DIAGNOSIS — F988 Other specified behavioral and emotional disorders with onset usually occurring in childhood and adolescence: Secondary | ICD-10-CM

## 2021-06-20 DIAGNOSIS — F419 Anxiety disorder, unspecified: Secondary | ICD-10-CM

## 2021-06-20 MED ORDER — FLUOXETINE HCL 20 MG PO CAPS: ORAL_CAPSULE | ORAL | 1 refills | Status: DC

## 2021-06-20 MED ORDER — AMPHETAMINE-DEXTROAMPHETAMINE 10 MG PO TABS: 10.0000 mg | ORAL_TABLET | Freq: Every day | ORAL | 0 refills | Status: DC

## 2021-06-20 MED ORDER — FLUCONAZOLE 150 MG PO TABS: 150.0000 mg | ORAL_TABLET | Freq: Once | ORAL | 0 refills | Status: AC

## 2021-06-20 MED ORDER — ONDANSETRON HCL 4 MG PO TABS: 4.0000 mg | ORAL_TABLET | Freq: Three times a day (TID) | ORAL | 0 refills | Status: DC | PRN

## 2021-06-20 MED ORDER — FAMOTIDINE 20 MG PO TABS: 20.0000 mg | ORAL_TABLET | Freq: Every day | ORAL | 1 refills | Status: DC

## 2021-06-20 NOTE — Patient Instructions (Addendum)
? ? ? ? ?  Medications changes include :   zofran for nausea.  Fluconazole for yeast  once ? ? ?Your prescription(s) have been sent to your pharmacy.  ? ? ? ? ?Return in about 6 months (around 12/21/2021) for CPE. ? ?

## 2021-06-20 NOTE — Assessment & Plan Note (Signed)
Acute ?When he and his girlfriend have unprotected sex she has recurrent yeast infections, but will not have them if they use a condom-he wonders if he has a yeast infection ?Has mild erythema from the penis, he is uncircumcised ?We will try fluconazole 150 mg x 1 ?Discussed change in his pH to see if that helps ?

## 2021-06-20 NOTE — Assessment & Plan Note (Signed)
Chronic ?Not controlled ?Restart pepcid 20 mg ?If heartburn is not controlled he will let me know ?

## 2021-06-20 NOTE — Assessment & Plan Note (Signed)
Chronic ?Has not been on medication for a while and would like to restart-I was prescribing his medication for him previously ?Restart Adderall 10 mg daily with breakfast ?Follow-up in 6 months ?

## 2021-06-20 NOTE — Assessment & Plan Note (Signed)
Chronic ?Not currently controlled since being off medication ?Restart fluoxetine 20 mg daily ?

## 2021-06-20 NOTE — Assessment & Plan Note (Addendum)
Acute ?Started about 1 month ago ?Waking up feeling nauseous and vomiting-has been becoming more consistent ?Has uncontrolled GERD, which may be contributing ?Zofran 4 mg every 8 hours as needed-we will give a one-time prescription ?Restart Pepcid 20 mg daily-if GERD is not controlled he will let me know ?Revise diet.  Discussed not eating within 3 hours of going to bed ?

## 2021-08-23 ENCOUNTER — Telehealth: Payer: Self-pay | Admitting: Internal Medicine

## 2021-08-23 ENCOUNTER — Other Ambulatory Visit: Payer: Self-pay

## 2021-08-23 DIAGNOSIS — F988 Other specified behavioral and emotional disorders with onset usually occurring in childhood and adolescence: Secondary | ICD-10-CM

## 2021-08-23 MED ORDER — FLUOXETINE HCL 20 MG PO CAPS: ORAL_CAPSULE | ORAL | 1 refills | Status: DC

## 2021-08-23 MED ORDER — FAMOTIDINE 20 MG PO TABS: 20.0000 mg | ORAL_TABLET | Freq: Every day | ORAL | 1 refills | Status: DC

## 2021-08-23 NOTE — Telephone Encounter (Signed)
PT calls today in need of a refill on three medications. PT would like a refill for their ADDERALL, PROZAC, and PEPCIC. PT [facility] [address] [facility] on [facility].

## 2021-08-24 MED ORDER — AMPHETAMINE-DEXTROAMPHETAMINE 10 MG PO TABS: 10.0000 mg | ORAL_TABLET | Freq: Every day | ORAL | 0 refills | Status: DC

## 2021-10-01 ENCOUNTER — Telehealth: Payer: Self-pay

## 2021-10-01 NOTE — Telephone Encounter (Signed)
Pt is requesting a refill on: amphetamine-dextroamphetamine (ADDERALL) 10 MG tablet  [id number] [place] [place] ROV 12/24/21

## 2021-10-02 ENCOUNTER — Other Ambulatory Visit: Payer: Self-pay | Admitting: Internal Medicine

## 2021-10-02 DIAGNOSIS — F988 Other specified behavioral and emotional disorders with onset usually occurring in childhood and adolescence: Secondary | ICD-10-CM

## 2021-10-02 MED ORDER — AMPHETAMINE-DEXTROAMPHETAMINE 10 MG PO TABS: 10.0000 mg | ORAL_TABLET | Freq: Every day | ORAL | 0 refills | Status: DC

## 2021-11-12 ENCOUNTER — Telehealth: Payer: Self-pay | Admitting: Internal Medicine

## 2021-11-12 DIAGNOSIS — F988 Other specified behavioral and emotional disorders with onset usually occurring in childhood and adolescence: Secondary | ICD-10-CM

## 2021-11-12 NOTE — Telephone Encounter (Signed)
Caller & Relationship to patient: Micheal Lewis  Call back number: [phone]  Date of last office visit: 06/20/21  Date of next office visit: 12/24/21  Medication(s) to be refilled: amphetamine-dextroamphetamine (ADDERALL) 10 MG tablet  Preferred Pharmacy:  [id number] [place] [place]:  [phone] Fax:  [phone]

## 2021-11-13 MED ORDER — AMPHETAMINE-DEXTROAMPHETAMINE 10 MG PO TABS: 10.0000 mg | ORAL_TABLET | Freq: Every day | ORAL | 0 refills | Status: DC

## 2021-12-18 ENCOUNTER — Telehealth: Payer: Self-pay | Admitting: Internal Medicine

## 2021-12-18 DIAGNOSIS — F988 Other specified behavioral and emotional disorders with onset usually occurring in childhood and adolescence: Secondary | ICD-10-CM

## 2021-12-18 MED ORDER — AMPHETAMINE-DEXTROAMPHETAMINE 10 MG PO TABS: 10.0000 mg | ORAL_TABLET | Freq: Every day | ORAL | 0 refills | Status: DC

## 2021-12-18 NOTE — Telephone Encounter (Signed)
Patient needs refill on his adderall - [facility] [address]

## 2021-12-23 NOTE — Progress Notes (Unsigned)
      Subjective:    Patient ID: Micheal Lewis, male    DOB: Mar 21, 1996, 26 y.o.   MRN: [id number]     HPI Micheal Lewis is here for follow up of his chronic medical problems, including ADD, anxiety, depression, GERD    Medications and allergies reviewed with patient and updated if appropriate.  Current Outpatient Medications on File Prior to Visit  Medication Sig Dispense Refill   albuterol (VENTOLIN HFA) 108 (90 Base) MCG/ACT inhaler Inhale 2 puffs into the lungs every 6 (six) hours as needed.     AMBULATORY NON FORMULARY MEDICATION Diltiazem: 2% with lidocaine 5% ointment Using your index finger, apply a small amount of medication inside the rectum up to your first knuckle/joint 4 times daily 30 g 0   amphetamine-dextroamphetamine (ADDERALL) 10 MG tablet Take 1 tablet (10 mg total) by mouth daily with breakfast. 30 tablet 0   famotidine (PEPCID) 20 MG tablet Take 1 tablet (20 mg total) by mouth daily. 90 tablet 1   FLUoxetine (PROZAC) 20 MG capsule TAKE 1 CAPSULE BY MOUTH EVERY DAY 90 capsule 1   ondansetron (ZOFRAN) 4 MG tablet Take 1 tablet (4 mg total) by mouth every 8 (eight) hours as needed for nausea or vomiting. 20 tablet 0   Psyllium (METAMUCIL PO) Take by mouth as needed.      No current facility-administered medications on file prior to visit.     Review of Systems     Objective:  There were no vitals filed for this visit. BP Readings from Last 3 Encounters:  06/20/21 130/82  06/09/20 130/72  04/12/20 120/84   Wt Readings from Last 3 Encounters:  06/20/21 180 lb (81.6 kg)  06/09/20 188 lb (85.3 kg)  04/12/20 190 lb (86.2 kg)   There is no height or weight on file to calculate BMI.    Physical Exam     Lab Results  Component Value Date   WBC 5.7 10/25/2019   HGB 15.6 10/25/2019   HCT 47.0 10/25/2019   PLT 282 10/25/2019   GLUCOSE 96 10/25/2019   CHOL 134 10/25/2019   TRIG 97 10/25/2019   HDL 41 10/25/2019   LDLCALC 75 10/25/2019   ALT 11  10/25/2019   AST 16 10/25/2019   NA 139 10/25/2019   K 4.4 10/25/2019   CL 106 10/25/2019   CREATININE 0.95 10/25/2019   BUN 13 10/25/2019   CO2 27 10/25/2019   TSH 2.13 10/25/2019     Assessment & Plan:    See Problem List for Assessment and Plan of chronic medical problems.   This encounter was created in error - please disregard.

## 2021-12-23 NOTE — Patient Instructions (Addendum)
     Blood work was ordered.     Medications changes include :      Your prescription(s) have been sent to your pharmacy.    A referral was ordered for XX.     Someone from that office will call you to schedule an appointment.    Return in about 6 months (around 06/25/2022) for follow up.

## 2021-12-24 ENCOUNTER — Encounter: Payer: BC Managed Care – PPO | Admitting: Internal Medicine

## 2021-12-24 ENCOUNTER — Encounter: Payer: Self-pay | Admitting: Internal Medicine

## 2021-12-24 DIAGNOSIS — F988 Other specified behavioral and emotional disorders with onset usually occurring in childhood and adolescence: Secondary | ICD-10-CM

## 2021-12-24 DIAGNOSIS — K219 Gastro-esophageal reflux disease without esophagitis: Secondary | ICD-10-CM

## 2021-12-24 DIAGNOSIS — F3289 Other specified depressive episodes: Secondary | ICD-10-CM

## 2021-12-24 DIAGNOSIS — F419 Anxiety disorder, unspecified: Secondary | ICD-10-CM

## 2021-12-24 NOTE — Assessment & Plan Note (Signed)
Chronic Controlled, stable Continue prozac 20 mg daily  

## 2021-12-24 NOTE — Assessment & Plan Note (Signed)
Chronic GERD controlled Continue famotidine 20 mg daily 

## 2021-12-24 NOTE — Assessment & Plan Note (Signed)
Chronic Controlled, stable Continue Adderall 10 mg daily

## 2022-01-18 ENCOUNTER — Telehealth: Payer: Self-pay

## 2022-01-18 NOTE — Telephone Encounter (Signed)
Given number of no-shows and canceled appointments-we will refill at his appointment next week

## 2022-01-18 NOTE — Telephone Encounter (Signed)
MEDICATION: amphetamine-dextroamphetamine (ADDERALL) 10 MG tablet  [id number] [place] [address]  Comments: Patient has one left. Advised of response time and that we may not be able to process it today. Pt verbalized understanding   **Let patient know to contact pharmacy at the end of the day to make sure medication is ready. **  ** Please notify patient to allow 48-72 hours to process**  **Encourage patient to contact the pharmacy for refills or [facility]

## 2022-01-18 NOTE — Telephone Encounter (Signed)
Patient informed he would have til next week for refill.

## 2022-01-20 ENCOUNTER — Encounter: Payer: Self-pay | Admitting: Internal Medicine

## 2022-01-20 NOTE — Patient Instructions (Addendum)
     Medications changes include :       A referral was ordered for XXX.     Someone will call you to schedule an appointment.    Return in about 6 months (around 07/24/2022) for follow up.

## 2022-01-20 NOTE — Progress Notes (Unsigned)
Subjective:    Patient ID: Micheal Lewis, male    DOB: 03/18/1996, 26 y.o.   MRN: [id number]     HPI [name] is here for follow up of his chronic medical problems, including ADD, depression, anxiety, GERD  He wondered about increasing adderall dose.   No concerns.   Medications and allergies reviewed with patient and updated if appropriate.  Current Outpatient Medications on File Prior to Visit  Medication Sig Dispense Refill   albuterol (VENTOLIN HFA) 108 (90 Base) MCG/ACT inhaler Inhale 2 puffs into the lungs every 6 (six) hours as needed.     AMBULATORY NON FORMULARY MEDICATION Diltiazem: 2% with lidocaine 5% ointment Using your index finger, apply a small amount of medication inside the rectum up to your first knuckle/joint 4 times daily 30 g 0   amphetamine-dextroamphetamine (ADDERALL) 10 MG tablet Take 1 tablet (10 mg total) by mouth daily with breakfast. 30 tablet 0   famotidine (PEPCID) 20 MG tablet Take 1 tablet (20 mg total) by mouth daily. 90 tablet 1   FLUoxetine (PROZAC) 20 MG capsule TAKE 1 CAPSULE BY MOUTH EVERY DAY 90 capsule 1   ondansetron (ZOFRAN) 4 MG tablet Take 1 tablet (4 mg total) by mouth every 8 (eight) hours as needed for nausea or vomiting. 20 tablet 0   Psyllium (METAMUCIL PO) Take by mouth as needed.      No current facility-administered medications on file prior to visit.     Review of Systems  Constitutional:  Negative for fever.  Respiratory:  Negative for cough, shortness of breath and wheezing.   Cardiovascular:  Negative for chest pain, palpitations and leg swelling.  Gastrointestinal:  Negative for abdominal pain and nausea.  Neurological:  Negative for light-headedness and headaches.       Objective:   Vitals:   01/23/22 0802  BP: 124/72  Pulse: 80  Temp: 98.3 F (36.8 C)  SpO2: 98%   BP Readings from Last 3 Encounters:  01/23/22 124/72  06/20/21 130/82  06/09/20 130/72   Wt Readings from Last 3 Encounters:  01/23/22  172 lb (78 kg)  06/20/21 180 lb (81.6 kg)  06/09/20 188 lb (85.3 kg)   Body mass index is 23.33 kg/m.    Physical Exam Constitutional:      General: He is not in acute distress.    Appearance: Normal appearance. He is not ill-appearing.  HENT:     Head: Normocephalic and atraumatic.  Eyes:     Conjunctiva/sclera: Conjunctivae normal.  Cardiovascular:     Rate and Rhythm: Normal rate and regular rhythm.     Heart sounds: Normal heart sounds. No murmur heard. Pulmonary:     Effort: Pulmonary effort is normal. No respiratory distress.     Breath sounds: Normal breath sounds. No wheezing or rales.  Musculoskeletal:     Right lower leg: No edema.     Left lower leg: No edema.  Skin:    General: Skin is warm and dry.     Findings: No rash.  Neurological:     Mental Status: He is alert. Mental status is at baseline.  Psychiatric:        Mood and Affect: Mood normal.        Lab Results  Component Value Date   WBC 5.7 10/25/2019   HGB 15.6 10/25/2019   HCT 47.0 10/25/2019   PLT 282 10/25/2019   GLUCOSE 96 10/25/2019   CHOL 134 10/25/2019   TRIG 97 10/25/2019  HDL 41 10/25/2019   LDLCALC 75 10/25/2019   ALT 11 10/25/2019   AST 16 10/25/2019   NA 139 10/25/2019   K 4.4 10/25/2019   CL 106 10/25/2019   CREATININE 0.95 10/25/2019   BUN 13 10/25/2019   CO2 27 10/25/2019   TSH 2.13 10/25/2019     Assessment & Plan:    Never completed HPV vaccine - will give #2 today and #3 in 6 months  See Problem List for Assessment and Plan of chronic medical problems.

## 2022-01-23 ENCOUNTER — Ambulatory Visit (INDEPENDENT_AMBULATORY_CARE_PROVIDER_SITE_OTHER): Payer: BC Managed Care – PPO | Admitting: Internal Medicine

## 2022-01-23 VITALS — BP 124/72 | HR 80 | Temp 98.3°F | Ht 72.0 in | Wt 172.0 lb

## 2022-01-23 DIAGNOSIS — F988 Other specified behavioral and emotional disorders with onset usually occurring in childhood and adolescence: Secondary | ICD-10-CM | POA: Diagnosis not present

## 2022-01-23 DIAGNOSIS — K219 Gastro-esophageal reflux disease without esophagitis: Secondary | ICD-10-CM

## 2022-01-23 DIAGNOSIS — F419 Anxiety disorder, unspecified: Secondary | ICD-10-CM | POA: Diagnosis not present

## 2022-01-23 DIAGNOSIS — F3289 Other specified depressive episodes: Secondary | ICD-10-CM | POA: Diagnosis not present

## 2022-01-23 DIAGNOSIS — Z23 Encounter for immunization: Secondary | ICD-10-CM | POA: Diagnosis not present

## 2022-01-23 MED ORDER — FAMOTIDINE 20 MG PO TABS: 20.0000 mg | ORAL_TABLET | Freq: Every day | ORAL | 1 refills | Status: AC

## 2022-01-23 MED ORDER — AMPHETAMINE-DEXTROAMPHETAMINE 15 MG PO TABS: 15.0000 mg | ORAL_TABLET | Freq: Every day | ORAL | 0 refills | Status: DC

## 2022-01-23 MED ORDER — FLUOXETINE HCL 20 MG PO CAPS: ORAL_CAPSULE | ORAL | 1 refills | Status: AC

## 2022-01-23 NOTE — Assessment & Plan Note (Addendum)
Chronic GERD intermittently Continue famotidine 20 mg daily as needed - has not been covered by insurance but will his his new insurance - advised to take as needed  GERD diet reviewed

## 2022-01-23 NOTE — Assessment & Plan Note (Signed)
Chronic Controlled, Stable Continue fluoxetine 20 mg daily 

## 2022-01-23 NOTE — Assessment & Plan Note (Addendum)
Chronic Controlled, Stable - makes him feel more present and efficient, can adhere to routines better Continue Adderall, but increase to 15 mg daily, which I think is reasonable since he is on such a low dose

## 2022-03-05 ENCOUNTER — Telehealth: Payer: Self-pay | Admitting: Internal Medicine

## 2022-03-05 MED ORDER — AMPHETAMINE-DEXTROAMPHETAMINE 15 MG PO TABS: 15.0000 mg | ORAL_TABLET | Freq: Every day | ORAL | 0 refills | Status: DC

## 2022-03-05 NOTE — Telephone Encounter (Signed)
Caller & Relationship to patient: Micheal Lewis  Call back number: [phone]  Date of last office visit: 01/23/22  Date of next office visit: 07/24/22  Medication(s) to be refilled:  amphetamine-dextroamphetamine (ADDERALL) 15 MG tablet    [facility] [address]

## 2022-04-26 ENCOUNTER — Telehealth: Payer: Self-pay

## 2022-04-26 MED ORDER — AMPHETAMINE-DEXTROAMPHETAMINE 15 MG PO TABS: 15.0000 mg | ORAL_TABLET | Freq: Every day | ORAL | 0 refills | Status: DC

## 2022-04-26 NOTE — Addendum Note (Signed)
Addended by: Binnie Rail on: 04/26/2022 04:42 PM   Modules accepted: Orders

## 2022-04-26 NOTE — Telephone Encounter (Signed)
Pt is asking for a rx refill for his   amphetamine-dextroamphetamine (ADDERALL) 15 MG tablet   to be sent in.

## 2022-05-02 DIAGNOSIS — S61213A Laceration without foreign body of left middle finger without damage to nail, initial encounter: Secondary | ICD-10-CM | POA: Diagnosis not present

## 2022-06-03 ENCOUNTER — Telehealth: Payer: Self-pay | Admitting: Internal Medicine

## 2022-06-03 MED ORDER — AMPHETAMINE-DEXTROAMPHETAMINE 15 MG PO TABS: 15.0000 mg | ORAL_TABLET | Freq: Every day | ORAL | 0 refills | Status: DC

## 2022-06-03 NOTE — Telephone Encounter (Signed)
Prescription Request  06/03/2022  LOV: 01/23/2022  What is the name of the medication or equipment? amphetamine-dextroamphetamine (ADDERALL) 15 MG tablet   Have you contacted your pharmacy to request a refill? No   Which pharmacy would you like this sent to?  [id number] [place] [place] [address]: [phone]: [phone]   Patient notified that their request is being sent to the clinical staff for review and that they should receive a response within 2 business days.   Please advise at Mobile [phone](mobile)   Patient has visit already scheduled for 07/24/22

## 2022-07-09 ENCOUNTER — Telehealth: Payer: Self-pay | Admitting: Internal Medicine

## 2022-07-09 NOTE — Telephone Encounter (Signed)
Prescription Request  07/09/2022  LOV: 01/23/2022  What is the name of the medication or equipment? Adderall 15 mg.  Have you contacted your pharmacy to request a refill? No   Which pharmacy would you like this sent to?  [phone] [place] [place] - [address] [place] [address] Phone: [phone] Fax: [phone]    Patient notified that their request is being sent to the clinical staff for review and that they should receive a response within 2 business days.   Please advise at Mobile [phone] (mobile)

## 2022-07-10 MED ORDER — AMPHETAMINE-DEXTROAMPHETAMINE 15 MG PO TABS: 15.0000 mg | ORAL_TABLET | Freq: Every day | ORAL | 0 refills | Status: AC

## 2022-07-23 NOTE — Progress Notes (Signed)
      Subjective:    Patient ID: Micheal Lewis, male    DOB: 03/08/1996, 27 y.o.   MRN: [id number]     HPI Micheal Lewis is here for follow up of his chronic medical problems.    Medications and allergies reviewed with patient and updated if appropriate.  Current Outpatient Medications on File Prior to Visit  Medication Sig Dispense Refill   albuterol (VENTOLIN HFA) 108 (90 Base) MCG/ACT inhaler Inhale 2 puffs into the lungs every 6 (six) hours as needed.     AMBULATORY NON FORMULARY MEDICATION Diltiazem: 2% with lidocaine 5% ointment Using your index finger, apply a small amount of medication inside the rectum up to your first knuckle/joint 4 times daily 30 g 0   amphetamine-dextroamphetamine (ADDERALL) 15 MG tablet Take 1 tablet by mouth daily. 30 tablet 0   famotidine (PEPCID) 20 MG tablet Take 1 tablet (20 mg total) by mouth daily. 90 tablet 1   FLUoxetine (PROZAC) 20 MG capsule TAKE 1 CAPSULE BY MOUTH EVERY DAY 90 capsule 1   ondansetron (ZOFRAN) 4 MG tablet Take 1 tablet (4 mg total) by mouth every 8 (eight) hours as needed for nausea or vomiting. 20 tablet 0   Psyllium (METAMUCIL PO) Take by mouth as needed.      No current facility-administered medications on file prior to visit.     Review of Systems     Objective:  There were no vitals filed for this visit. BP Readings from Last 3 Encounters:  01/23/22 124/72  06/20/21 130/82  06/09/20 130/72   Wt Readings from Last 3 Encounters:  01/23/22 172 lb (78 kg)  06/20/21 180 lb (81.6 kg)  06/09/20 188 lb (85.3 kg)   There is no height or weight on file to calculate BMI.    Physical Exam     Lab Results  Component Value Date   WBC 5.7 10/25/2019   HGB 15.6 10/25/2019   HCT 47.0 10/25/2019   PLT 282 10/25/2019   GLUCOSE 96 10/25/2019   CHOL 134 10/25/2019   TRIG 97 10/25/2019   HDL 41 10/25/2019   LDLCALC 75 10/25/2019   ALT 11 10/25/2019   AST 16 10/25/2019   NA 139 10/25/2019   K 4.4 10/25/2019   CL  106 10/25/2019   CREATININE 0.95 10/25/2019   BUN 13 10/25/2019   CO2 27 10/25/2019   TSH 2.13 10/25/2019     Assessment & Plan:    See Problem List for Assessment and Plan of chronic medical problems.   This encounter was created in error - please disregard.

## 2022-07-23 NOTE — Patient Instructions (Addendum)
    HPV vaccine #3 given    Medications changes include :   none    A referral was ordered for XXX.     Someone will call you to schedule an appointment.    Return in about 6 months (around 01/24/2023) for Physical Exam.

## 2022-07-24 ENCOUNTER — Encounter: Payer: Self-pay | Admitting: Internal Medicine

## 2022-07-24 ENCOUNTER — Encounter: Payer: BC Managed Care – PPO | Admitting: Internal Medicine

## 2022-07-24 DIAGNOSIS — F419 Anxiety disorder, unspecified: Secondary | ICD-10-CM

## 2022-07-24 DIAGNOSIS — F988 Other specified behavioral and emotional disorders with onset usually occurring in childhood and adolescence: Secondary | ICD-10-CM

## 2022-07-24 DIAGNOSIS — K219 Gastro-esophageal reflux disease without esophagitis: Secondary | ICD-10-CM

## 2022-07-24 DIAGNOSIS — F3289 Other specified depressive episodes: Secondary | ICD-10-CM

## 2022-07-24 NOTE — Assessment & Plan Note (Signed)
Chronic Controlled, Stable Continue fluoxetine 20 mg daily 

## 2022-07-24 NOTE — Assessment & Plan Note (Signed)
Chronic GERD intermittently Continue famotidine 20 mg daily as needed

## 2022-07-24 NOTE — Assessment & Plan Note (Signed)
Chronic Controlled, Stable - makes him feel more present and efficient, can adhere to routines better Continue Adderall 15 mg daily

## 2022-09-03 ENCOUNTER — Other Ambulatory Visit: Payer: Self-pay | Admitting: Internal Medicine
